# Patient Record
Sex: Male | Born: 1935 | Race: Black or African American | Hispanic: No | Marital: Married | Smoking: Never smoker
Health system: Southern US, Community
[De-identification: ages and names within clinical notes are randomized; demographics above are authoritative.]

## PROBLEM LIST (undated history)

## (undated) DIAGNOSIS — N289 Disorder of kidney and ureter, unspecified: Secondary | ICD-10-CM

## (undated) DIAGNOSIS — F0390 Unspecified dementia without behavioral disturbance: Secondary | ICD-10-CM

## (undated) DIAGNOSIS — I4901 Ventricular fibrillation: Secondary | ICD-10-CM

## (undated) DIAGNOSIS — K562 Volvulus: Secondary | ICD-10-CM

## (undated) DIAGNOSIS — M109 Gout, unspecified: Secondary | ICD-10-CM

## (undated) DIAGNOSIS — I214 Non-ST elevation (NSTEMI) myocardial infarction: Secondary | ICD-10-CM

## (undated) DIAGNOSIS — I469 Cardiac arrest, cause unspecified: Secondary | ICD-10-CM

## (undated) DIAGNOSIS — K859 Acute pancreatitis without necrosis or infection, unspecified: Secondary | ICD-10-CM

## (undated) DIAGNOSIS — I509 Heart failure, unspecified: Secondary | ICD-10-CM

## (undated) HISTORY — PX: COLECTOMY: SHX59

## (undated) HISTORY — PX: CT PERC CHOLECYSTOSTOMY: HXRAD817

---

## 2009-09-18 ENCOUNTER — Emergency Department (HOSPITAL_COMMUNITY): Admission: EM | Admit: 2009-09-18 | Discharge: 2009-09-18 | Payer: Self-pay | Admitting: Emergency Medicine

## 2009-09-20 ENCOUNTER — Emergency Department (HOSPITAL_COMMUNITY): Admission: EM | Admit: 2009-09-20 | Discharge: 2009-09-20 | Payer: Self-pay | Admitting: Emergency Medicine

## 2016-08-11 DIAGNOSIS — K562 Volvulus: Secondary | ICD-10-CM

## 2016-08-11 HISTORY — DX: Volvulus: K56.2

## 2016-09-30 ENCOUNTER — Other Ambulatory Visit (HOSPITAL_COMMUNITY): Payer: Medicare Other

## 2016-09-30 ENCOUNTER — Inpatient Hospital Stay
Admission: AD | Admit: 2016-09-30 | Discharge: 2016-12-22 | Disposition: A | Discharge status: Hospice | Payer: Medicare Other | Source: Other Acute Inpatient Hospital | Attending: Internal Medicine | Admitting: Internal Medicine

## 2016-09-30 DIAGNOSIS — R062 Wheezing: Secondary | ICD-10-CM

## 2016-09-30 DIAGNOSIS — Z4659 Encounter for fitting and adjustment of other gastrointestinal appliance and device: Secondary | ICD-10-CM

## 2016-09-30 DIAGNOSIS — Z9889 Other specified postprocedural states: Secondary | ICD-10-CM

## 2016-09-30 DIAGNOSIS — J189 Pneumonia, unspecified organism: Secondary | ICD-10-CM

## 2016-09-30 DIAGNOSIS — Z01818 Encounter for other preprocedural examination: Secondary | ICD-10-CM

## 2016-09-30 DIAGNOSIS — H04229 Epiphora due to insufficient drainage, unspecified lacrimal gland: Secondary | ICD-10-CM

## 2016-09-30 DIAGNOSIS — I429 Cardiomyopathy, unspecified: Secondary | ICD-10-CM

## 2016-09-30 DIAGNOSIS — Z431 Encounter for attention to gastrostomy: Secondary | ICD-10-CM

## 2016-09-30 DIAGNOSIS — IMO0002 Reserved for concepts with insufficient information to code with codable children: Secondary | ICD-10-CM

## 2016-09-30 DIAGNOSIS — Z95828 Presence of other vascular implants and grafts: Secondary | ICD-10-CM

## 2016-09-30 DIAGNOSIS — T82868A Thrombosis of vascular prosthetic devices, implants and grafts, initial encounter: Secondary | ICD-10-CM

## 2016-09-30 DIAGNOSIS — K769 Liver disease, unspecified: Secondary | ICD-10-CM

## 2016-09-30 DIAGNOSIS — T85698S Other mechanical complication of other specified internal prosthetic devices, implants and grafts, sequela: Secondary | ICD-10-CM

## 2016-09-30 DIAGNOSIS — N179 Acute kidney failure, unspecified: Secondary | ICD-10-CM | POA: Diagnosis present

## 2016-09-30 DIAGNOSIS — I4901 Ventricular fibrillation: Secondary | ICD-10-CM

## 2016-09-30 DIAGNOSIS — Z931 Gastrostomy status: Secondary | ICD-10-CM

## 2016-09-30 DIAGNOSIS — J9690 Respiratory failure, unspecified, unspecified whether with hypoxia or hypercapnia: Secondary | ICD-10-CM | POA: Diagnosis present

## 2016-09-30 DIAGNOSIS — I214 Non-ST elevation (NSTEMI) myocardial infarction: Secondary | ICD-10-CM | POA: Diagnosis present

## 2016-09-30 DIAGNOSIS — I469 Cardiac arrest, cause unspecified: Secondary | ICD-10-CM | POA: Diagnosis present

## 2016-09-30 DIAGNOSIS — Z992 Dependence on renal dialysis: Secondary | ICD-10-CM

## 2016-09-30 DIAGNOSIS — I82C19 Acute embolism and thrombosis of unspecified internal jugular vein: Secondary | ICD-10-CM

## 2016-09-30 HISTORY — DX: Acute pancreatitis without necrosis or infection, unspecified: K85.90

## 2016-09-30 HISTORY — DX: Heart failure, unspecified: I50.9

## 2016-09-30 HISTORY — DX: Cardiac arrest, cause unspecified: I46.9

## 2016-09-30 HISTORY — DX: Gout, unspecified: M10.9

## 2016-09-30 HISTORY — DX: Unspecified dementia, unspecified severity, without behavioral disturbance, psychotic disturbance, mood disturbance, and anxiety: F03.90

## 2016-09-30 HISTORY — DX: Disorder of kidney and ureter, unspecified: N28.9

## 2016-09-30 HISTORY — DX: Volvulus: K56.2

## 2016-09-30 HISTORY — DX: Non-ST elevation (NSTEMI) myocardial infarction: I21.4

## 2016-09-30 HISTORY — DX: Ventricular fibrillation: I49.01

## 2016-09-30 LAB — BLOOD GAS, ARTERIAL
Acid-base deficit: 9.2 mmol/L — ABNORMAL HIGH (ref 0.0–2.0)
Bicarbonate: 14.4 mmol/L — ABNORMAL LOW (ref 20.0–28.0)
Delivery systems: POSITIVE
EXPIRATORY PAP: 8
FIO2: 30
INSPIRATORY PAP: 16
O2 SAT: 97.1 %
PO2 ART: 92.7 mmHg (ref 83.0–108.0)
Patient temperature: 99.1
pCO2 arterial: 22.6 mmHg — ABNORMAL LOW (ref 32.0–48.0)
pH, Arterial: 7.422 (ref 7.350–7.450)

## 2016-10-01 LAB — COMPREHENSIVE METABOLIC PANEL
ALT: 35 U/L (ref 17–63)
ANION GAP: 10 (ref 5–15)
AST: 69 U/L — ABNORMAL HIGH (ref 15–41)
Albumin: 1.9 g/dL — ABNORMAL LOW (ref 3.5–5.0)
Alkaline Phosphatase: 65 U/L (ref 38–126)
BILIRUBIN TOTAL: 0.7 mg/dL (ref 0.3–1.2)
BUN: 112 mg/dL — ABNORMAL HIGH (ref 6–20)
CHLORIDE: 110 mmol/L (ref 101–111)
CO2: 16 mmol/L — ABNORMAL LOW (ref 22–32)
Calcium: 8.1 mg/dL — ABNORMAL LOW (ref 8.9–10.3)
Creatinine, Ser: 6.78 mg/dL — ABNORMAL HIGH (ref 0.61–1.24)
GFR calc Af Amer: 8 mL/min — ABNORMAL LOW (ref >60)
GFR, EST NON AFRICAN AMERICAN: 7 mL/min — AB (ref >60)
Glucose, Bld: 121 mg/dL — ABNORMAL HIGH (ref 65–99)
Potassium: 4.1 mmol/L (ref 3.5–5.1)
Sodium: 136 mmol/L (ref 135–145)
TOTAL PROTEIN: 7.3 g/dL (ref 6.5–8.1)

## 2016-10-01 LAB — CBC WITH DIFFERENTIAL/PLATELET
Basophils Absolute: 0 K/uL (ref 0.0–0.1)
Basophils Relative: 0 %
Eosinophils Absolute: 0.3 K/uL (ref 0.0–0.7)
Eosinophils Relative: 2 %
HCT: 26.5 % — ABNORMAL LOW (ref 39.0–52.0)
Hemoglobin: 8.5 g/dL — ABNORMAL LOW (ref 13.0–17.0)
Lymphocytes Relative: 13 %
Lymphs Abs: 2.1 K/uL (ref 0.7–4.0)
MCH: 28.2 pg (ref 26.0–34.0)
MCHC: 32.1 g/dL (ref 30.0–36.0)
MCV: 88 fL (ref 78.0–100.0)
Monocytes Absolute: 0.7 K/uL (ref 0.1–1.0)
Monocytes Relative: 4 %
Neutro Abs: 13.2 K/uL — ABNORMAL HIGH (ref 1.7–7.7)
Neutrophils Relative %: 81 %
Platelets: 146 K/uL — ABNORMAL LOW (ref 150–400)
RBC: 3.01 MIL/uL — ABNORMAL LOW (ref 4.22–5.81)
RDW: 16.5 % — ABNORMAL HIGH (ref 11.5–15.5)
WBC: 16.3 K/uL — ABNORMAL HIGH (ref 4.0–10.5)

## 2016-10-01 LAB — PROTIME-INR
INR: 1.91
Prothrombin Time: 22.1 seconds — ABNORMAL HIGH (ref 11.4–15.2)

## 2016-10-02 ENCOUNTER — Other Ambulatory Visit (HOSPITAL_COMMUNITY): Payer: Medicare Other

## 2016-10-02 ENCOUNTER — Encounter (HOSPITAL_COMMUNITY): Payer: Self-pay | Admitting: Interventional Radiology

## 2016-10-02 HISTORY — PX: IR GENERIC HISTORICAL: IMG1180011

## 2016-10-02 LAB — BASIC METABOLIC PANEL
Anion gap: 13 (ref 5–15)
BUN: 129 mg/dL — AB (ref 6–20)
CALCIUM: 8.3 mg/dL — AB (ref 8.9–10.3)
CHLORIDE: 110 mmol/L (ref 101–111)
CO2: 14 mmol/L — AB (ref 22–32)
CREATININE: 7.76 mg/dL — AB (ref 0.61–1.24)
GFR calc non Af Amer: 6 mL/min — ABNORMAL LOW (ref >60)
GFR, EST AFRICAN AMERICAN: 7 mL/min — AB (ref >60)
Glucose, Bld: 120 mg/dL — ABNORMAL HIGH (ref 65–99)
Potassium: 4.3 mmol/L (ref 3.5–5.1)
Sodium: 137 mmol/L (ref 135–145)

## 2016-10-02 LAB — CBC
HCT: 26.2 % — ABNORMAL LOW (ref 39.0–52.0)
HEMOGLOBIN: 8.6 g/dL — AB (ref 13.0–17.0)
MCH: 28.7 pg (ref 26.0–34.0)
MCHC: 32.8 g/dL (ref 30.0–36.0)
MCV: 87.3 fL (ref 78.0–100.0)
Platelets: 164 10*3/uL (ref 150–400)
RBC: 3 MIL/uL — ABNORMAL LOW (ref 4.22–5.81)
RDW: 16 % — AB (ref 11.5–15.5)
WBC: 15.5 10*3/uL — ABNORMAL HIGH (ref 4.0–10.5)

## 2016-10-02 MED ORDER — LIDOCAINE HCL (PF) 1 % IJ SOLN
INTRAMUSCULAR | Status: AC
  Filled 2016-10-02: qty 30

## 2016-10-02 MED ORDER — HEPARIN SODIUM (PORCINE) 1000 UNIT/ML IJ SOLN
INTRAMUSCULAR | Status: AC
  Filled 2016-10-02: qty 1

## 2016-10-02 NOTE — Consult Note (Signed)
[facility] CONSULT NOTE    Date: 10/02/2016                  Patient Name:  Jesus Vega  MRN: [id number]  DOB: 17-Jan-1932  Age / Sex: 83 y.o., male         PCP: No primary care provider on file.                 Service Requesting Consult: Hospitalist                 Reason for Consult: Acute renal failure, CKD stage IV             History of Present Illness: Patient is a 83 y.o. male with a PMHx of dementia, chronic kidney disease stage IV, congestive heart failure ejection fraction 25-30%, who was admitted to [facility] on 09/30/2016 for ongoing management of cholecystitis and volvulus status post exploratory laparotomy with colectomy. Patient was previously in [place]. The patient's cholecystitis was apparently treated with cholecystostomy tube. Patient also had a volvulus and had colectomy with subsequent colostomy formation.It was felt that he would most likely have a guarded prognosis however family desired aggressive care.  Patient's renal function has been quite poor while here. BUN continues to rise up to 129 with a creatinine of 7.7.   Medications: Current medications: Current Facility-Administered Medications  Medication Dose Route Frequency Provider Last Rate Last Dose  . heparin 1000 UNIT/ML injection           . lidocaine (PF) (XYLOCAINE) 1 % injection               Allergies: No known drug allergies   Past Medical History: Dementia Chronic kidney disease stage IV Congestive heart failure ejection fraction 25-30% Recent cholecystitis with volvulus Status post exploratory laparotomy with colectomy Respiratory failure   Past Surgical History: Past Surgical History:  Procedure Laterality Date  . IR GENERIC HISTORICAL  10/02/2016   IR FLUORO GUIDE CV LINE RIGHT 10/02/2016 Malachy Moan, MD [facility]  . IR GENERIC HISTORICAL  10/02/2016   IR US GUIDE VASC ACCESS RIGHT 10/02/2016 Malachy Moan, MD [facility]      Family History: Unable to obtain directly from the patient.  Social History: Unable to obtain from patient with altered mental status.   Review of Systems: Unable to obtain from patient and altered mental status.  Vital Signs: Pulse 87 respirations 31 blood pressure 110/67 pulse ox 92%  Physical Exam: General: Critically ill-appearing, currently on BiPAP.  Head: Normocephalic, atraumatic.  Eyes: Anicteric, EOMI  Nose: No epistaxis noted  Throat: Bipap facemask on  Neck: Supple, trachea midline.  Lungs:  Bilateral rhonchi, tachypneic  Heart: S1S2 no rubs  Abdomen:  Soft NT, bowel sound present, colostomy present  Extremities: No pretibial edema.  Neurologic: Awake, not following commands  Skin: No visible rashes, scars.    Lab results: Basic Metabolic Panel:  Recent Labs Lab 10/01/16 0600 10/02/16 1029  NA 136 137  K 4.1 4.3  CL 110 110  CO2 16* 14*  GLUCOSE 121* 120*  BUN 112* 129*  CREATININE 6.78* 7.76*  CALCIUM 8.1* 8.3*    Liver Function Tests:  Recent Labs Lab 10/01/16 0600  AST 69*  ALT 35  ALKPHOS 65  BILITOT 0.7  PROT 7.3  ALBUMIN 1.9*   No results for input(s): LIPASE, AMYLASE in the last 168 hours. No results for input(s): AMMONIA in the last 168 hours.  CBC:  Recent Labs Lab 10/01/16 0600 10/02/16 1029  WBC 16.3* 15.5*  NEUTROABS 13.2*  --   HGB 8.5* 8.6*  HCT 26.5* 26.2*  MCV 88.0 87.3  PLT 146* 164    Cardiac Enzymes: No results for input(s): CKTOTAL, CKMB, CKMBINDEX, TROPONINI in the last 168 hours.  BNP: Invalid input(s): POCBNP  CBG: No results for input(s): GLUCAP in the last 168 hours.  Microbiology: No results found for this or any previous visit.  Coagulation Studies:  Recent Labs  10/01/16 0600  LABPROT 22.1*  INR 1.91    Urinalysis: No results for input(s): COLORURINE, LABSPEC, PHURINE, GLUCOSEU, HGBUR, BILIRUBINUR, KETONESUR, PROTEINUR, UROBILINOGEN, NITRITE, LEUKOCYTESUR in the last 72  hours.  Invalid input(s): APPERANCEUR    Imaging: Ir Fluoro Guide Cv Line Right  Result Date: 10/02/2016 INDICATION: 83 year old male in need of hemodialysis. EXAM: IR RIGHT FLOURO GUIDE CV LINE; IR ULTRASOUND GUIDANCE VASC ACCESS RIGHT MEDICATIONS: None required ANESTHESIA/SEDATION: None required. FLUOROSCOPY TIME:  Fluoroscopy Time: 0 minutes 6 seconds (1 mGy). COMPLICATIONS: None immediate. PROCEDURE: Informed written consent was obtained from the patient after a thorough discussion of the procedural risks, benefits and alternatives. All questions were addressed. Maximal Sterile Barrier Technique was utilized including caps, mask, sterile gowns, sterile gloves, sterile drape, hand hygiene and skin antiseptic. A timeout was performed prior to the initiation of the procedure. The right internal jugular vein was interrogated with ultrasound and found to be widely patent. An image was obtained and stored for the medical record. Local anesthesia was attained by infiltration with 1% lidocaine. A small dermatotomy was made. Under real-time sonographic guidance, the vessel was punctured with an 18 gauge needle. An Amplatz wire was advanced into the right heart and into the inferior vena cava. The needle was removed over the wire and a 20 cm Mahurkar non tunneled hemodialysis catheter with an additional central venous access port was advanced over the wire and position with the tip in the upper right atrium. The wire was removed. The catheter was secured to the skin with 0 Prolene suture. The catheter was found to work well and was flushed with heparinized saline. Sterile bandages were applied. The patient tolerated the procedure well. IMPRESSION: Successful placement of a 20 cm Mahurkar non tunneled hemodialysis catheter with additional central venous access port via a right internal jugular vein approach. The catheter tips are in the upper right atrium and ready for immediate use. Electronically Signed   By:  Malachy Moan M.D.   On: 10/02/2016 15:29   Ir US Guide Vasc Access Right  Result Date: 10/02/2016 INDICATION: 83 year old male in need of hemodialysis. EXAM: IR RIGHT FLOURO GUIDE CV LINE; IR ULTRASOUND GUIDANCE VASC ACCESS RIGHT MEDICATIONS: None required ANESTHESIA/SEDATION: None required. FLUOROSCOPY TIME:  Fluoroscopy Time: 0 minutes 6 seconds (1 mGy). COMPLICATIONS: None immediate. PROCEDURE: Informed written consent was obtained from the patient after a thorough discussion of the procedural risks, benefits and alternatives. All questions were addressed. Maximal Sterile Barrier Technique was utilized including caps, mask, sterile gowns, sterile gloves, sterile drape, hand hygiene and skin antiseptic. A timeout was performed prior to the initiation of the procedure. The right internal jugular vein was interrogated with ultrasound and found to be widely patent. An image was obtained and stored for the medical record. Local anesthesia was attained by infiltration with 1% lidocaine. A small dermatotomy was made. Under real-time sonographic guidance, the vessel was punctured with an 18 gauge needle. An Amplatz wire was advanced into the right heart and into the  inferior vena cava. The needle was removed over the wire and a 20 cm Mahurkar non tunneled hemodialysis catheter with an additional central venous access port was advanced over the wire and position with the tip in the upper right atrium. The wire was removed. The catheter was secured to the skin with 0 Prolene suture. The catheter was found to work well and was flushed with heparinized saline. Sterile bandages were applied. The patient tolerated the procedure well. IMPRESSION: Successful placement of a 20 cm Mahurkar non tunneled hemodialysis catheter with additional central venous access port via a right internal jugular vein approach. The catheter tips are in the upper right atrium and ready for immediate use. Electronically Signed   By: Malachy Moan M.D.   On: 10/02/2016 15:29   Dg Chest Port 1 View  Result Date: 09/30/2016 CLINICAL DATA:  Acute respiratory failure. Nasogastric tube placement. EXAM: PORTABLE CHEST 1 VIEW COMPARISON:  None. FINDINGS: Nasogastric tube is seen with tip overlying the distal stomach. A left jugular central venous catheter seen with tip overlying the proximal SVC. Pigtail drainage catheter is seen in the right upper quadrant the abdomen, likely representing a cholecystostomy tube. No pneumothorax visualized. A moderate layering right pleural effusion is seen as well as a small left pleural effusion. Airspace opacity is seen bilaterally within the mid and lower lung zones. Heart size is within normal limits. Caps IMPRESSION: Bilateral layering pleural effusions, right side greater than left. Bilateral mid and lower lung airspace opacity. Nasogastric tube tip overlies the distal stomach. Electronically Signed   By: Myles Rosenthal M.D.   On: 09/30/2016 19:51   Dg Abd Portable 1v  Result Date: 10/02/2016 CLINICAL DATA:  NG tube placement EXAM: PORTABLE ABDOMEN - 1 VIEW COMPARISON:  09/30/2016 FINDINGS: Enteric tube terminates in the distal second portion of the duodenum. Mildly prominent loops of small bowel. Stool within the right colon. IMPRESSION: Enteric tube terminates in the distal second portion of the duodenum. Electronically Signed   By: Charline Bills M.D.   On: 10/02/2016 14:01   Dg Abd Portable 1v  Result Date: 09/30/2016 CLINICAL DATA:  NG tube placement EXAM: PORTABLE ABDOMEN - 1 VIEW COMPARISON:  None. FINDINGS: Mild gaseous distension of the stomach. Normal small bowel gas pattern. Degenerative changes lumbar spine. Mild levoscoliosis lumbar spine. There is NG tube in place with tip in mid stomach. IMPRESSION: NG tube in place with tip in mid stomach. Electronically Signed   By: Natasha Mead M.D.   On: 09/30/2016 19:52      Assessment & Plan: Pt is a 83 y.o. African American male with a PMHx of  dementia, chronic kidney disease stage IV, congestive heart failure ejection fraction 25-30%, who was admitted to [facility] on 09/30/2016 for ongoing management of cholecystitis and volvulus status post exploratory laparotomy with colectomy. Patient was previously in [place].   1.  Acute renal failure. 2.  CKD stage IV. 3.  Acute respiratory failure. 4.  Metabolic acidosis. 5.  Anemia of CKD.  Plan:  The patient's wife requests aggressive care in regards to his underlying acute renal failure. BUN currently up to 129 with a creatinine of 7.7.At this point and we will proceed with a trial of hemodialysis. We will plan for his first dialysis treatment tomorrow a.m. Temporalis his catheter was placed in the right internal jugular vein. Dialysis should help to improve his azotemia as well as his underlying metabolic acidosis.Patient remains on BiPAP at the moment. However his overall  prognosis remains quite guarded. We will continue to monitor his status closely with you.

## 2016-10-02 NOTE — Procedures (Signed)
Interventional Radiology Procedure Note  Procedure: Placement of a Right IJ 20 cm Mahurker non-tunneled HD catheter with additional CVC port. Tip location:  Upper RA and ready for use.   Complications: None  Estimated Blood Loss: 0  Recommendations:  - Routine line care  Signed,  Sterling BigHeath K. Vanecia Limpert, MD

## 2016-10-03 LAB — CBC
HEMATOCRIT: 22.1 % — AB (ref 39.0–52.0)
Hemoglobin: 7.3 g/dL — ABNORMAL LOW (ref 13.0–17.0)
MCH: 28.6 pg (ref 26.0–34.0)
MCHC: 33 g/dL (ref 30.0–36.0)
MCV: 86.7 fL (ref 78.0–100.0)
PLATELETS: 182 10*3/uL (ref 150–400)
RBC: 2.55 MIL/uL — ABNORMAL LOW (ref 4.22–5.81)
RDW: 15.9 % — AB (ref 11.5–15.5)
WBC: 13.2 10*3/uL — ABNORMAL HIGH (ref 4.0–10.5)

## 2016-10-03 LAB — RENAL FUNCTION PANEL
ALBUMIN: 1.7 g/dL — AB (ref 3.5–5.0)
Anion gap: 14 (ref 5–15)
BUN: 141 mg/dL — AB (ref 6–20)
CO2: 13 mmol/L — ABNORMAL LOW (ref 22–32)
CREATININE: 8.47 mg/dL — AB (ref 0.61–1.24)
Calcium: 8.4 mg/dL — ABNORMAL LOW (ref 8.9–10.3)
Chloride: 110 mmol/L (ref 101–111)
GFR calc Af Amer: 6 mL/min — ABNORMAL LOW (ref >60)
GFR calc non Af Amer: 5 mL/min — ABNORMAL LOW (ref >60)
GLUCOSE: 138 mg/dL — AB (ref 65–99)
Phosphorus: 5.2 mg/dL — ABNORMAL HIGH (ref 2.5–4.6)
Potassium: 4.3 mmol/L (ref 3.5–5.1)
SODIUM: 137 mmol/L (ref 135–145)

## 2016-10-04 ENCOUNTER — Other Ambulatory Visit (HOSPITAL_COMMUNITY): Payer: Medicare Other

## 2016-10-04 LAB — HEPATITIS B CORE ANTIBODY, IGM: Hep B C IgM: NEGATIVE

## 2016-10-04 LAB — HEPATITIS B SURFACE ANTIGEN: Hepatitis B Surface Ag: NEGATIVE

## 2016-10-04 LAB — HEPATITIS B SURFACE ANTIBODY,QUALITATIVE: HEP B S AB: NONREACTIVE

## 2016-10-04 NOTE — Progress Notes (Signed)
[facility]  ROUNDING NOTE   Subjective:  Patient continues to have worsening azotemia. Yesterday BUN was up to 141 with a creatinine of 8.47. However the patient did have first dialysis treatment yesterday. The plan for second dose of treatment tomorrow.   Objective:  Vital signs in last 24 hours:  Temperature 97.4 pulse 79 respirations 28 blood pressure 124/54  Physical Exam: General: Chronically ill-appearing  Head: BiPAP facemask on  Eyes: Anicteric  Neck: Supple, trachea midline  Lungs:  Coarse rhonchi bilateral, currently on BiPAP  Heart: S1S2 no rubs  Abdomen:  Soft, nontender, bowel sounds present  Extremities: trace peripheral edema.  Neurologic: Currently not following commands  Skin: No lesions  Access: R IJ temp HD catheter    Basic Metabolic Panel:  Recent Labs Lab 10/01/16 0600 10/02/16 1029 10/03/16 0500  NA 136 137 137  K 4.1 4.3 4.3  CL 110 110 110  CO2 16* 14* 13*  GLUCOSE 121* 120* 138*  BUN 112* 129* 141*  CREATININE 6.78* 7.76* 8.47*  CALCIUM 8.1* 8.3* 8.4*  PHOS  --   --  5.2*    Liver Function Tests:  Recent Labs Lab 10/01/16 0600 10/03/16 0500  AST 69*  --   ALT 35  --   ALKPHOS 65  --   BILITOT 0.7  --   PROT 7.3  --   ALBUMIN 1.9* 1.7*   No results for input(s): LIPASE, AMYLASE in the last 168 hours. No results for input(s): AMMONIA in the last 168 hours.  CBC:  Recent Labs Lab 10/01/16 0600 10/02/16 1029 10/03/16 0500  WBC 16.3* 15.5* 13.2*  NEUTROABS 13.2*  --   --   HGB 8.5* 8.6* 7.3*  HCT 26.5* 26.2* 22.1*  MCV 88.0 87.3 86.7  PLT 146* 164 182    Cardiac Enzymes: No results for input(s): CKTOTAL, CKMB, CKMBINDEX, TROPONINI in the last 168 hours.  BNP: Invalid input(s): POCBNP  CBG: No results for input(s): GLUCAP in the last 168 hours.  Microbiology: No results found for this or any previous visit.  Coagulation Studies: No results for input(s): LABPROT, INR in the last 72  hours.  Urinalysis: No results for input(s): COLORURINE, LABSPEC, PHURINE, GLUCOSEU, HGBUR, BILIRUBINUR, KETONESUR, PROTEINUR, UROBILINOGEN, NITRITE, LEUKOCYTESUR in the last 72 hours.  Invalid input(s): APPERANCEUR    Imaging: Dg Chest Port 1 View  Result Date: 10/04/2016 CLINICAL DATA:  Acute respiratory failure. EXAM: PORTABLE CHEST 1 VIEW COMPARISON:  09/30/2016 FINDINGS: Right IJ double lumen dialysis catheter tip is in the region of the cavoatrial junction. A left IJ catheter tip is near the brachiocephalic SVC junction. NG tube is coursing down the esophagus and into the stomach. Persistent right-sided pleural effusion and bilateral infiltrates. A small left effusion is also likely. IMPRESSION: Support apparatus in good position without complicating features. Persistent effusions and infiltrates. Electronically Signed   By: Rudie Meyer M.D.   On: 10/04/2016 08:26     Medications:       Assessment/ Plan:  83 y.o.  African American male with a PMHx of dementia, chronic kidney disease stage IV, congestive heart failure ejection fraction 25-30%, who was admitted to [facility] on 09/30/2016 for ongoing management of cholecystitis and volvulus status post exploratory laparotomy with colectomy. Patient was previously in [place].  1.  Acute renal failure. 2.  CKD stage IV. 3.  Acute respiratory failure. 4.  Metabolic acidosis. 5.  Anemia of CKD.  Plan:  Patient had first hemodialysis treatment yesterday. We'll  plan for a second dialysis treatment tomorrow and then switching to a Monday, Wednesday, Friday schedule. Continue to monitor serum electrolytes. Patient remains on BiPAP for his acute respiratory failure.  Hemoglobin currently down to 7.3. Consider blood transfusion for hemoglobin of 7 or less. We will continue to follow along with you.   LOS: 0 Jesus Vega 1/24/20184:13 PM

## 2016-10-05 LAB — CBC
HCT: 21.9 % — ABNORMAL LOW (ref 39.0–52.0)
Hemoglobin: 7.1 g/dL — ABNORMAL LOW (ref 13.0–17.0)
MCH: 28.3 pg (ref 26.0–34.0)
MCHC: 32.4 g/dL (ref 30.0–36.0)
MCV: 87.3 fL (ref 78.0–100.0)
PLATELETS: 190 10*3/uL (ref 150–400)
RBC: 2.51 MIL/uL — ABNORMAL LOW (ref 4.22–5.81)
RDW: 16.2 % — AB (ref 11.5–15.5)
WBC: 12.9 10*3/uL — AB (ref 4.0–10.5)

## 2016-10-05 LAB — RENAL FUNCTION PANEL
Albumin: 1.5 g/dL — ABNORMAL LOW (ref 3.5–5.0)
Anion gap: 15 (ref 5–15)
BUN: 140 mg/dL — AB (ref 6–20)
CALCIUM: 8.2 mg/dL — AB (ref 8.9–10.3)
CO2: 15 mmol/L — ABNORMAL LOW (ref 22–32)
Chloride: 108 mmol/L (ref 101–111)
Creatinine, Ser: 8.35 mg/dL — ABNORMAL HIGH (ref 0.61–1.24)
GFR calc Af Amer: 6 mL/min — ABNORMAL LOW (ref >60)
GFR, EST NON AFRICAN AMERICAN: 5 mL/min — AB (ref >60)
Glucose, Bld: 174 mg/dL — ABNORMAL HIGH (ref 65–99)
PHOSPHORUS: 4.9 mg/dL — AB (ref 2.5–4.6)
Potassium: 4.1 mmol/L (ref 3.5–5.1)
Sodium: 138 mmol/L (ref 135–145)

## 2016-10-05 MED ORDER — ALBUMIN HUMAN 25 % IV SOLN
INTRAVENOUS | Status: AC
  Filled 2016-10-05: qty 100

## 2016-10-06 ENCOUNTER — Other Ambulatory Visit (HOSPITAL_COMMUNITY): Payer: Medicare Other

## 2016-10-06 LAB — CBC
HCT: 22.8 % — ABNORMAL LOW (ref 39.0–52.0)
HEMOGLOBIN: 7.5 g/dL — AB (ref 13.0–17.0)
MCH: 28.6 pg (ref 26.0–34.0)
MCHC: 32.9 g/dL (ref 30.0–36.0)
MCV: 87 fL (ref 78.0–100.0)
PLATELETS: 169 10*3/uL (ref 150–400)
RBC: 2.62 MIL/uL — AB (ref 4.22–5.81)
RDW: 16 % — ABNORMAL HIGH (ref 11.5–15.5)
WBC: 17.3 10*3/uL — ABNORMAL HIGH (ref 4.0–10.5)

## 2016-10-06 LAB — RENAL FUNCTION PANEL
ANION GAP: 12 (ref 5–15)
Albumin: 1.9 g/dL — ABNORMAL LOW (ref 3.5–5.0)
BUN: 95 mg/dL — ABNORMAL HIGH (ref 6–20)
CHLORIDE: 108 mmol/L (ref 101–111)
CO2: 17 mmol/L — AB (ref 22–32)
Calcium: 7.8 mg/dL — ABNORMAL LOW (ref 8.9–10.3)
Creatinine, Ser: 6.22 mg/dL — ABNORMAL HIGH (ref 0.61–1.24)
GFR calc non Af Amer: 7 mL/min — ABNORMAL LOW (ref >60)
GFR, EST AFRICAN AMERICAN: 9 mL/min — AB (ref >60)
GLUCOSE: 125 mg/dL — AB (ref 65–99)
Phosphorus: 3.1 mg/dL (ref 2.5–4.6)
Potassium: 4.4 mmol/L (ref 3.5–5.1)
Sodium: 137 mmol/L (ref 135–145)

## 2016-10-06 NOTE — Progress Notes (Signed)
[facility]  ROUNDING NOTE   Subjective:  No new labs this a.m. Patient due for hemodialysis again today. Overall patient is critically ill.   Objective:  Vital signs in last 24 hours:  Pulse 77 respirations 25 blood pressure 105/75 pulse ox 92% on BiPAP  Physical Exam: General: Chronically ill-appearing  Head: BiPAP facemask on  Eyes: Anicteric  Neck: Supple, trachea midline  Lungs:  Coarse rhonchi bilateral, currently on BiPAP  Heart: S1S2 no rubs  Abdomen:  Soft, nontender, bowel sounds present  Extremities: trace peripheral edema.  Neurologic: Currently not following commands  Skin: No lesions  Access: R IJ temp HD catheter    Basic Metabolic Panel:  Recent Labs Lab 10/01/16 0600 10/02/16 1029 10/03/16 0500 10/05/16 0643  NA 136 137 137 138  K 4.1 4.3 4.3 4.1  CL 110 110 110 108  CO2 16* 14* 13* 15*  GLUCOSE 121* 120* 138* 174*  BUN 112* 129* 141* 140*  CREATININE 6.78* 7.76* 8.47* 8.35*  CALCIUM 8.1* 8.3* 8.4* 8.2*  PHOS  --   --  5.2* 4.9*    Liver Function Tests:  Recent Labs Lab 10/01/16 0600 10/03/16 0500 10/05/16 0643  AST 69*  --   --   ALT 35  --   --   ALKPHOS 65  --   --   BILITOT 0.7  --   --   PROT 7.3  --   --   ALBUMIN 1.9* 1.7* 1.5*   No results for input(s): LIPASE, AMYLASE in the last 168 hours. No results for input(s): AMMONIA in the last 168 hours.  CBC:  Recent Labs Lab 10/01/16 0600 10/02/16 1029 10/03/16 0500 10/05/16 0643 10/06/16 0806  WBC 16.3* 15.5* 13.2* 12.9* 17.3*  NEUTROABS 13.2*  --   --   --   --   HGB 8.5* 8.6* 7.3* 7.1* 7.5*  HCT 26.5* 26.2* 22.1* 21.9* 22.8*  MCV 88.0 87.3 86.7 87.3 87.0  PLT 146* 164 182 190 169    Cardiac Enzymes: No results for input(s): CKTOTAL, CKMB, CKMBINDEX, TROPONINI in the last 168 hours.  BNP: Invalid input(s): POCBNP  CBG: No results for input(s): GLUCAP in the last 168 hours.  Microbiology: No results found for this or any previous  visit.  Coagulation Studies: No results for input(s): LABPROT, INR in the last 72 hours.  Urinalysis: No results for input(s): COLORURINE, LABSPEC, PHURINE, GLUCOSEU, HGBUR, BILIRUBINUR, KETONESUR, PROTEINUR, UROBILINOGEN, NITRITE, LEUKOCYTESUR in the last 72 hours.  Invalid input(s): APPERANCEUR    Imaging: No results found.   Medications:       Assessment/ Plan:  83 y.o.  African American male with a PMHx of dementia, chronic kidney disease stage IV, congestive heart failure ejection fraction 25-30%, who was admitted to [facility] on 09/30/2016 for ongoing management of cholecystitis and volvulus status post exploratory laparotomy with colectomy. Patient was previously in [place].  1.  Acute renal failure. 2.  CKD stage IV. 3.  Acute respiratory failure. 4.  Metabolic acidosis. 5.  Anemia of CKD.  Plan:  Patient due for his third dialysis treatment today. He will be maintained on a Monday, Wednesday, Friday dialysis schedule. Orders for today have been prepared. Will also prepare dialysis orders for Monday. Continue to monitor hemoglobin and his hemoglobin was found to be 7.5 today. Consider blood transfusion for hemoglobin of 7 or less. Patient remains on BiPAP. Wean as tolerated. Overall prognosis appears quite guarded given underlying medical condition.   LOS: 0  Jesus Vega 1/26/20188:46 AM

## 2016-10-07 LAB — BLOOD GAS, ARTERIAL
Acid-Base Excess: 0.5 mmol/L (ref 0.0–2.0)
Bicarbonate: 23.6 mmol/L (ref 20.0–28.0)
DELIVERY SYSTEMS: POSITIVE
EXPIRATORY PAP: 8
FIO2: 30
INSPIRATORY PAP: 16
LHR: 25 {breaths}/min
O2 Saturation: 98.8 %
Patient temperature: 98.6
pCO2 arterial: 31.6 mmHg — ABNORMAL LOW (ref 32.0–48.0)
pH, Arterial: 7.486 — ABNORMAL HIGH (ref 7.350–7.450)
pO2, Arterial: 122 mmHg — ABNORMAL HIGH (ref 83.0–108.0)

## 2016-10-07 LAB — CBC
HCT: 21 % — ABNORMAL LOW (ref 39.0–52.0)
Hemoglobin: 6.7 g/dL — CL (ref 13.0–17.0)
MCH: 27.9 pg (ref 26.0–34.0)
MCHC: 31.9 g/dL (ref 30.0–36.0)
MCV: 87.5 fL (ref 78.0–100.0)
PLATELETS: 179 10*3/uL (ref 150–400)
RBC: 2.4 MIL/uL — AB (ref 4.22–5.81)
RDW: 16.1 % — ABNORMAL HIGH (ref 11.5–15.5)
WBC: 15.1 10*3/uL — AB (ref 4.0–10.5)

## 2016-10-07 LAB — PREPARE RBC (CROSSMATCH)

## 2016-10-07 LAB — ABO/RH: ABO/RH(D): A POS

## 2016-10-07 LAB — PROCALCITONIN: PROCALCITONIN: 3.03 ng/mL

## 2016-10-07 LAB — AMMONIA: Ammonia: 29 umol/L (ref 9–35)

## 2016-10-08 LAB — CBC
HEMATOCRIT: 24.9 % — AB (ref 39.0–52.0)
HEMOGLOBIN: 8.1 g/dL — AB (ref 13.0–17.0)
MCH: 28.5 pg (ref 26.0–34.0)
MCHC: 32.5 g/dL (ref 30.0–36.0)
MCV: 87.7 fL (ref 78.0–100.0)
Platelets: 158 10*3/uL (ref 150–400)
RBC: 2.84 MIL/uL — ABNORMAL LOW (ref 4.22–5.81)
RDW: 16.2 % — AB (ref 11.5–15.5)
WBC: 12.7 10*3/uL — AB (ref 4.0–10.5)

## 2016-10-08 LAB — TYPE AND SCREEN
ABO/RH(D): A POS
ANTIBODY SCREEN: NEGATIVE
Unit division: 0

## 2016-10-09 LAB — CBC
HEMATOCRIT: 25.3 % — AB (ref 39.0–52.0)
HEMOGLOBIN: 8.1 g/dL — AB (ref 13.0–17.0)
MCH: 28.1 pg (ref 26.0–34.0)
MCHC: 32 g/dL (ref 30.0–36.0)
MCV: 87.8 fL (ref 78.0–100.0)
Platelets: 157 10*3/uL (ref 150–400)
RBC: 2.88 MIL/uL — ABNORMAL LOW (ref 4.22–5.81)
RDW: 16.3 % — ABNORMAL HIGH (ref 11.5–15.5)
WBC: 12.8 10*3/uL — ABNORMAL HIGH (ref 4.0–10.5)

## 2016-10-09 LAB — RENAL FUNCTION PANEL
Albumin: 1.6 g/dL — ABNORMAL LOW (ref 3.5–5.0)
Anion gap: 9 (ref 5–15)
BUN: 89 mg/dL — ABNORMAL HIGH (ref 6–20)
CHLORIDE: 102 mmol/L (ref 101–111)
CO2: 24 mmol/L (ref 22–32)
Calcium: 7.9 mg/dL — ABNORMAL LOW (ref 8.9–10.3)
Creatinine, Ser: 5.6 mg/dL — ABNORMAL HIGH (ref 0.61–1.24)
GFR calc Af Amer: 10 mL/min — ABNORMAL LOW (ref >60)
GFR calc non Af Amer: 8 mL/min — ABNORMAL LOW (ref >60)
GLUCOSE: 151 mg/dL — AB (ref 65–99)
PHOSPHORUS: 2.3 mg/dL — AB (ref 2.5–4.6)
POTASSIUM: 3.9 mmol/L (ref 3.5–5.1)
Sodium: 135 mmol/L (ref 135–145)

## 2016-10-09 NOTE — Progress Notes (Signed)
[facility]  ROUNDING NOTE   Subjective:  Patient remains critically ill Seen during HD. BFR 350; DFR 800 BP is low Currently on BiPAP tachypneic   Objective:  Vital signs in last 24 hours:  Pulse 74 respirations 26 blood pressure 95/53 pulse ox 91-92% on BiPAP  Physical Exam: General: Chronically ill-appearing  Head: BiPAP facemask on  Eyes: Anicteric  Neck: Supple, trachea midline  Lungs:  Coarse rhonchi bilateral, currently on BiPAP, tachypneic  Heart: S1S2 no rubs  Abdomen:  Soft, nontender,   Extremities: ++ dependent edema  Over thighs  Neurologic: Currently not following commands  Skin: No lesions  Access: R IJ temp HD catheter    Basic Metabolic Panel:  Recent Labs Lab 10/03/16 0500 10/05/16 0643 10/06/16 0806 10/09/16 0609  NA 137 138 137 135  K 4.3 4.1 4.4 3.9  CL 110 108 108 102  CO2 13* 15* 17* 24  GLUCOSE 138* 174* 125* 151*  BUN 141* 140* 95* 89*  CREATININE 8.47* 8.35* 6.22* 5.60*  CALCIUM 8.4* 8.2* 7.8* 7.9*  PHOS 5.2* 4.9* 3.1 2.3*    Liver Function Tests:  Recent Labs Lab 10/03/16 0500 10/05/16 0643 10/06/16 0806 10/09/16 0609  ALBUMIN 1.7* 1.5* 1.9* 1.6*   No results for input(s): LIPASE, AMYLASE in the last 168 hours.  Recent Labs Lab 10/07/16 1903  AMMONIA 29    CBC:  Recent Labs Lab 10/05/16 0643 10/06/16 0806 10/07/16 0600 10/08/16 1643 10/09/16 0609  WBC 12.9* 17.3* 15.1* 12.7* 12.8*  HGB 7.1* 7.5* 6.7* 8.1* 8.1*  HCT 21.9* 22.8* 21.0* 24.9* 25.3*  MCV 87.3 87.0 87.5 87.7 87.8  PLT 190 169 179 158 157    Cardiac Enzymes: No results for input(s): CKTOTAL, CKMB, CKMBINDEX, TROPONINI in the last 168 hours.  BNP: Invalid input(s): POCBNP  CBG: No results for input(s): GLUCAP in the last 168 hours.  Microbiology: Results for orders placed or performed during the hospital encounter of 09/30/16  Culture, blood (routine x 2)     Status: None (Preliminary result)   Collection Time: 10/06/16   4:19 PM  Result Value Ref Range Status   Specimen Description BLOOD HEMODIALYSIS CATHETER  Final   Special Requests BOTTLES DRAWN AEROBIC ONLY 5CC  Final   Culture NO GROWTH 2 DAYS  Final   Report Status PENDING  Incomplete  Culture, blood (routine x 2)     Status: None (Preliminary result)   Collection Time: 10/06/16  4:20 PM  Result Value Ref Range Status   Specimen Description BLOOD HEMODIALYSIS CATHETER  Final   Special Requests BOTTLES DRAWN AEROBIC ONLY 10CC  Final   Culture NO GROWTH 2 DAYS  Final   Report Status PENDING  Incomplete    Coagulation Studies: No results for input(s): LABPROT, INR in the last 72 hours.  Urinalysis: No results for input(s): COLORURINE, LABSPEC, PHURINE, GLUCOSEU, HGBUR, BILIRUBINUR, KETONESUR, PROTEINUR, UROBILINOGEN, NITRITE, LEUKOCYTESUR in the last 72 hours.  Invalid input(s): APPERANCEUR    Imaging: No results found.   Medications:       Assessment/ Plan:  83 y.o.  African American male with a PMHx of dementia, chronic kidney disease stage IV, congestive heart failure ejection fraction 25-30%, who was admitted to [facility] on 09/30/2016 for ongoing management of cholecystitis and volvulus status post exploratory laparotomy with colectomy. Patient was previously in [place].  1.  Acute renal failure. 2.  CKD stage IV. 3.  Acute respiratory failure. 4.  Metabolic acidosis. 5.  Anemia of CKD.  Plan:  Patient  will be maintained on a Monday, Wednesday, Friday dialysis schedule.    Patient remains on BiPAP. Wean as tolerated. Tachypneic likely due to pleural effusions 3rd spacing of fluid from hypoalbuminemia Iv albumin during HD Overall prognosis appears quite guarded given underlying medical condition.   LOS: 0 Caris Cerveny 1/29/20182:31 PM

## 2016-10-10 ENCOUNTER — Other Ambulatory Visit (HOSPITAL_COMMUNITY): Payer: Medicare Other

## 2016-10-11 ENCOUNTER — Other Ambulatory Visit (HOSPITAL_COMMUNITY): Payer: Medicare Other

## 2016-10-11 ENCOUNTER — Institutional Professional Consult (permissible substitution) (HOSPITAL_COMMUNITY): Payer: Medicare Other

## 2016-10-11 LAB — CBC
HCT: 23.4 % — ABNORMAL LOW (ref 39.0–52.0)
Hemoglobin: 7.4 g/dL — ABNORMAL LOW (ref 13.0–17.0)
MCH: 28.4 pg (ref 26.0–34.0)
MCHC: 31.6 g/dL (ref 30.0–36.0)
MCV: 89.7 fL (ref 78.0–100.0)
PLATELETS: 149 10*3/uL — AB (ref 150–400)
RBC: 2.61 MIL/uL — ABNORMAL LOW (ref 4.22–5.81)
RDW: 17 % — ABNORMAL HIGH (ref 11.5–15.5)
WBC: 13.8 10*3/uL — ABNORMAL HIGH (ref 4.0–10.5)

## 2016-10-11 LAB — CULTURE, BLOOD (ROUTINE X 2)
CULTURE: NO GROWTH
Culture: NO GROWTH

## 2016-10-11 LAB — RENAL FUNCTION PANEL
ALBUMIN: 1.7 g/dL — AB (ref 3.5–5.0)
ANION GAP: 12 (ref 5–15)
BUN: 63 mg/dL — AB (ref 6–20)
CO2: 24 mmol/L (ref 22–32)
Calcium: 8 mg/dL — ABNORMAL LOW (ref 8.9–10.3)
Chloride: 97 mmol/L — ABNORMAL LOW (ref 101–111)
Creatinine, Ser: 4.53 mg/dL — ABNORMAL HIGH (ref 0.61–1.24)
GFR calc Af Amer: 13 mL/min — ABNORMAL LOW (ref >60)
GFR, EST NON AFRICAN AMERICAN: 11 mL/min — AB (ref >60)
Glucose, Bld: 171 mg/dL — ABNORMAL HIGH (ref 65–99)
PHOSPHORUS: 3 mg/dL (ref 2.5–4.6)
POTASSIUM: 4.3 mmol/L (ref 3.5–5.1)
SODIUM: 133 mmol/L — AB (ref 135–145)

## 2016-10-11 NOTE — Progress Notes (Signed)
[facility]  ROUNDING NOTE   Subjective:  Patient remains critically ill Seen during HD. BFR 350; DFR 800 BP is low Off of BiPAP D10 drip at 50 cc/hr   Objective:  Vital signs in last 24 hours:  Temp 97.6 Pulse 74 respirations 20 blood pressure 107/58   Physical Exam: General: Chronically ill-appearing  Head: [place] O2, NG tube in place  Eyes: Anicteric  Neck: Supple, trachea midline  Lungs:  Coarse rhonchi bilateral, [place] O2  Heart: S1S2 no rubs  Abdomen:  Soft, nontender,   Extremities: ++ dependent edema  Over thighs  Neurologic: Currently not following commands  Skin: No lesions  Access: R IJ temp HD catheter    Basic Metabolic Panel:  Recent Labs Lab 10/05/16 0643 10/06/16 0806 10/09/16 0609 10/11/16 0703  NA 138 137 135 133*  K 4.1 4.4 3.9 4.3  CL 108 108 102 97*  CO2 15* 17* 24 24  GLUCOSE 174* 125* 151* 171*  BUN 140* 95* 89* 63*  CREATININE 8.35* 6.22* 5.60* 4.53*  CALCIUM 8.2* 7.8* 7.9* 8.0*  PHOS 4.9* 3.1 2.3* 3.0    Liver Function Tests:  Recent Labs Lab 10/05/16 0643 10/06/16 0806 10/09/16 0609 10/11/16 0703  ALBUMIN 1.5* 1.9* 1.6* 1.7*   No results for input(s): LIPASE, AMYLASE in the last 168 hours.  Recent Labs Lab 10/07/16 1903  AMMONIA 29    CBC:  Recent Labs Lab 10/06/16 0806 10/07/16 0600 10/08/16 1643 10/09/16 0609 10/11/16 0703  WBC 17.3* 15.1* 12.7* 12.8* 13.8*  HGB 7.5* 6.7* 8.1* 8.1* 7.4*  HCT 22.8* 21.0* 24.9* 25.3* 23.4*  MCV 87.0 87.5 87.7 87.8 89.7  PLT 169 179 158 157 149*    Cardiac Enzymes: No results for input(s): CKTOTAL, CKMB, CKMBINDEX, TROPONINI in the last 168 hours.  BNP: Invalid input(s): POCBNP  CBG: No results for input(s): GLUCAP in the last 168 hours.  Microbiology: Results for orders placed or performed during the hospital encounter of 09/30/16  Culture, blood (routine x 2)     Status: None   Collection Time: 10/06/16  4:19 PM  Result Value Ref Range Status   Specimen  Description BLOOD HEMODIALYSIS CATHETER  Final   Special Requests BOTTLES DRAWN AEROBIC ONLY 5CC  Final   Culture NO GROWTH 5 DAYS  Final   Report Status 10/11/2016 FINAL  Final  Culture, blood (routine x 2)     Status: None   Collection Time: 10/06/16  4:20 PM  Result Value Ref Range Status   Specimen Description BLOOD HEMODIALYSIS CATHETER  Final   Special Requests BOTTLES DRAWN AEROBIC ONLY 10CC  Final   Culture NO GROWTH 5 DAYS  Final   Report Status 10/11/2016 FINAL  Final    Coagulation Studies: No results for input(s): LABPROT, INR in the last 72 hours.  Urinalysis: No results for input(s): COLORURINE, LABSPEC, PHURINE, GLUCOSEU, HGBUR, BILIRUBINUR, KETONESUR, PROTEINUR, UROBILINOGEN, NITRITE, LEUKOCYTESUR in the last 72 hours.  Invalid input(s): APPERANCEUR    Imaging: Dg Abd Portable 1v  Result Date: 10/11/2016 CLINICAL DATA:  NG tube placement. EXAM: PORTABLE ABDOMEN - 1 VIEW COMPARISON:  10 minutes prior. FINDINGS: The tip of the enteric tube is below the diaphragm in the stomach, side-port in the region of the gastroesophageal junction. Advancement of at least 3 cm would place the side-port below the diaphragm. Unchanged bowel gas pattern from prior. Right upper quadrant drain in place. IMPRESSION: Tip of the enteric tube below the diaphragm in the stomach. Side-port in the region  of the gastroesophageal junction, advancement of 3 cm would place the side-port in the stomach. Electronically Signed   By: Rubye OaksMelanie  Ehinger M.D.   On: 10/11/2016 02:56   Dg Abd Portable 1v  Result Date: 10/11/2016 CLINICAL DATA:  Nasogastric tube placement EXAM: PORTABLE ABDOMEN - 1 VIEW COMPARISON:  10/10/2016 FINDINGS: The nasogastric tube continues to be coiled, not extending below the diaphragm. Tip of the tube extends back more proximally and is above the upper edge of this image. IMPRESSION: Nasogastric tube does not extend below the diaphragm. It is turned on itself with the tip directed  proximally. Electronically Signed   By: Ellery Plunkaniel R Mitchell M.D.   On: 10/11/2016 02:51   Dg Abd Portable 1v  Result Date: 10/10/2016 CLINICAL DATA:  Nasogastric tube placement.  Initial encounter. EXAM: PORTABLE ABDOMEN - 1 VIEW COMPARISON:  Abdominal radiograph performed 10/06/2016 FINDINGS: The patient's enteric tube is seen coiled at the level of the distal esophagus and extending back to the level of the upper esophagus. This should be retracted at least 25 cm and replaced. The visualized bowel gas pattern is grossly unremarkable. A drainage catheter is noted at the right upper quadrant. Small bilateral pleural effusions are seen. The cardiomediastinal silhouette remains enlarged. Mild degenerative change is noted at the lower lumbar spine. The patient's right IJ line is noted ending about the distal SVC. IMPRESSION: 1. Enteric tube noted coiled at the level of the distal esophagus and extending back to the level of the upper esophagus. This should be retracted at least 25 cm and replaced. 2. Cardiomegaly.  Small bilateral pleural effusions seen. These results were called by telephone at the time of interpretation on 10/10/2016 at 5:07 pm to Joy RN at [facility], who verbally acknowledged these results. Electronically Signed   By: Roanna RaiderJeffery  Chang M.D.   On: 10/10/2016 17:08     Medications:       Assessment/ Plan:  83 y.o.  African American male with a PMHx of dementia, chronic kidney disease stage IV, congestive heart failure ejection fraction 25-30%, who was admitted to [facility] on 09/30/2016 for ongoing management of cholecystitis and volvulus status post exploratory laparotomy with colectomy. Patient was previously in [place].  1.  Acute renal failure. 2.  CKD stage IV. 3.  Acute respiratory failure. 4.  Metabolic acidosis.    Plan:  Patient  will be maintained on a Monday, Wednesday, Friday dialysis schedule.    3rd spacing of fluid from  hypoalbuminemia Iv albumin during HD- 1200 cc removed so far in to treatment Overall prognosis appears quite guarded given underlying medical conditions.   LOS: 0 Akilah Cureton 1/31/20184:11 PM

## 2016-10-12 ENCOUNTER — Other Ambulatory Visit (HOSPITAL_COMMUNITY): Payer: Medicare Other

## 2016-10-13 LAB — BLOOD GAS, ARTERIAL
ACID-BASE EXCESS: 2.7 mmol/L — AB (ref 0.0–2.0)
BICARBONATE: 26.8 mmol/L (ref 20.0–28.0)
Delivery systems: POSITIVE
EXPIRATORY PAP: 8
FIO2: 60
Inspiratory PAP: 16
O2 SAT: 99.2 %
PCO2 ART: 42 mmHg (ref 32.0–48.0)
PH ART: 7.421 (ref 7.350–7.450)
PO2 ART: 159 mmHg — AB (ref 83.0–108.0)
Patient temperature: 98.6

## 2016-10-13 LAB — CBC
HCT: 24.3 % — ABNORMAL LOW (ref 39.0–52.0)
Hemoglobin: 7.7 g/dL — ABNORMAL LOW (ref 13.0–17.0)
MCH: 28.5 pg (ref 26.0–34.0)
MCHC: 31.7 g/dL (ref 30.0–36.0)
MCV: 90 fL (ref 78.0–100.0)
PLATELETS: 192 10*3/uL (ref 150–400)
RBC: 2.7 MIL/uL — AB (ref 4.22–5.81)
RDW: 16.3 % — ABNORMAL HIGH (ref 11.5–15.5)
WBC: 14.1 10*3/uL — AB (ref 4.0–10.5)

## 2016-10-13 LAB — RENAL FUNCTION PANEL
ALBUMIN: 1.8 g/dL — AB (ref 3.5–5.0)
Anion gap: 10 (ref 5–15)
BUN: 51 mg/dL — AB (ref 6–20)
CALCIUM: 7.9 mg/dL — AB (ref 8.9–10.3)
CHLORIDE: 97 mmol/L — AB (ref 101–111)
CO2: 23 mmol/L (ref 22–32)
CREATININE: 4.07 mg/dL — AB (ref 0.61–1.24)
GFR, EST AFRICAN AMERICAN: 14 mL/min — AB (ref >60)
GFR, EST NON AFRICAN AMERICAN: 12 mL/min — AB (ref >60)
Glucose, Bld: 148 mg/dL — ABNORMAL HIGH (ref 65–99)
PHOSPHORUS: 3.2 mg/dL (ref 2.5–4.6)
Potassium: 4.3 mmol/L (ref 3.5–5.1)
SODIUM: 130 mmol/L — AB (ref 135–145)

## 2016-10-13 NOTE — Progress Notes (Signed)
[facility]  ROUNDING NOTE   Subjective:   Seen and examined on hemodialysis. Hypotensive during treatment. Unable to pull ultrafiltration. Will end up positive after treatment.    Objective:  Vital signs in last 24 hours:  Temp 97.7 Pulse 82 respirations 35 blood pressure 90/37 SpO2 91%  Physical Exam: General: Chronically ill-appearing  Head: BiPap, NG tube in place  Eyes: Anicteric  Neck: Supple, trachea midline  Lungs:  Bilateral crackles  Heart: S1S2 no rubs  Abdomen:  Soft, nontender,   Extremities: ++ dependent edema  Neurologic: not following commands  Skin: No lesions  Access: R IJ temp HD catheter    Basic Metabolic Panel:  Recent Labs Lab 10/09/16 0609 10/11/16 0703 10/13/16 0845  NA 135 133* 130*  K 3.9 4.3 4.3  CL 102 97* 97*  CO2 24 24 23   GLUCOSE 151* 171* 148*  BUN 89* 63* 51*  CREATININE 5.60* 4.53* 4.07*  CALCIUM 7.9* 8.0* 7.9*  PHOS 2.3* 3.0 3.2    Liver Function Tests:  Recent Labs Lab 10/09/16 0609 10/11/16 0703 10/13/16 0845  ALBUMIN 1.6* 1.7* 1.8*   No results for input(s): LIPASE, AMYLASE in the last 168 hours.  Recent Labs Lab 10/07/16 1903  AMMONIA 29    CBC:  Recent Labs Lab 10/07/16 0600 10/08/16 1643 10/09/16 0609 10/11/16 0703 10/13/16 0845  WBC 15.1* 12.7* 12.8* 13.8* 14.1*  HGB 6.7* 8.1* 8.1* 7.4* 7.7*  HCT 21.0* 24.9* 25.3* 23.4* 24.3*  MCV 87.5 87.7 87.8 89.7 90.0  PLT 179 158 157 149* 192    Cardiac Enzymes: No results for input(s): CKTOTAL, CKMB, CKMBINDEX, TROPONINI in the last 168 hours.  BNP: Invalid input(s): POCBNP  CBG: No results for input(s): GLUCAP in the last 168 hours.  Microbiology: Results for orders placed or performed during the hospital encounter of 09/30/16  Culture, blood (routine x 2)     Status: None   Collection Time: 10/06/16  4:19 PM  Result Value Ref Range Status   Specimen Description BLOOD HEMODIALYSIS CATHETER  Final   Special Requests BOTTLES DRAWN  AEROBIC ONLY 5CC  Final   Culture NO GROWTH 5 DAYS  Final   Report Status 10/11/2016 FINAL  Final  Culture, blood (routine x 2)     Status: None   Collection Time: 10/06/16  4:20 PM  Result Value Ref Range Status   Specimen Description BLOOD HEMODIALYSIS CATHETER  Final   Special Requests BOTTLES DRAWN AEROBIC ONLY 10CC  Final   Culture NO GROWTH 5 DAYS  Final   Report Status 10/11/2016 FINAL  Final    Coagulation Studies: No results for input(s): LABPROT, INR in the last 72 hours.  Urinalysis: No results for input(s): COLORURINE, LABSPEC, PHURINE, GLUCOSEU, HGBUR, BILIRUBINUR, KETONESUR, PROTEINUR, UROBILINOGEN, NITRITE, LEUKOCYTESUR in the last 72 hours.  Invalid input(s): APPERANCEUR    Imaging: Dg Chest Port 1 View  Result Date: 10/12/2016 CLINICAL DATA:  Respiratory failure EXAM: PORTABLE CHEST 1 VIEW COMPARISON:  10/04/2016 FINDINGS: Cardiac shadow is stable. Right jugular central line and nasogastric catheter are stable. Left jugular central line is been removed in the interval. Right-sided pleural effusion is again identified and stable. Increasing left-sided effusion is noted. Central vascular congestion is noted. IMPRESSION: Increasing left-sided pleural effusion. Stable right pleural effusion. Increased vascular congestion. Electronically Signed   By: Alcide CleverMark  Lukens M.D.   On: 10/12/2016 08:08   Dg Abd Portable 1v  Result Date: 10/11/2016 CLINICAL DATA:  Nasogastric tube placement EXAM: PORTABLE ABDOMEN - 1  VIEW COMPARISON:  Portable exam 1734 hours compared to 0245 hours FINDINGS: Tip of nasogastric tube projects over stomach though uncertain position of the proximal side-port with respect to the GE junction, suspect above ; recommend advancing tube 5 cm. Nonobstructive bowel gas pattern. Bibasilar pleural effusions and atelectasis noted. Numerous leads project over abdomen. IMPRESSION: Tip of nasogastric tube projects over stomach though the proximal side-port is likely above  the gastroesophageal junction, recommend advancing tube 5 cm. Electronically Signed   By: Ulyses Southward M.D.   On: 10/11/2016 17:56     Medications:       Assessment/ Plan:  83 y.o.  African American male with a PMHx of dementia, chronic kidney disease stage IV, congestive heart failure ejection fraction 25-30%, who was admitted to [facility] on 09/30/2016 for ongoing management of cholecystitis and volvulus status post exploratory laparotomy with colectomy. Patient was previously in [place].  1.  Acute renal failure. 2.  CKD stage IV. 3.  Acute respiratory failure. 4.  Metabolic acidosis.    Plan:  Patient  will be maintained on a Monday, Wednesday, Friday dialysis schedule.    3rd spacing of fluid secondary to hypoalbuminemia Iv albumin during hemodialysis treatment.  Overall prognosis appears quite guarded given underlying medical conditions.   LOS: 0 Trayonna Bachmeier 2/2/20184:40 PM

## 2016-10-15 ENCOUNTER — Institutional Professional Consult (permissible substitution) (HOSPITAL_COMMUNITY): Payer: Medicare Other

## 2016-10-15 ENCOUNTER — Other Ambulatory Visit (HOSPITAL_COMMUNITY): Payer: Medicare Other

## 2016-10-15 LAB — BLOOD GAS, ARTERIAL
ACID-BASE EXCESS: 1.2 mmol/L (ref 0.0–2.0)
BICARBONATE: 25.1 mmol/L (ref 20.0–28.0)
FIO2: 40
LHR: 16 {breaths}/min
MECHVT: 500 mL
O2 Saturation: 97.6 %
PEEP: 5 cmH2O
PO2 ART: 92.4 mmHg (ref 83.0–108.0)
Patient temperature: 98
pCO2 arterial: 37.9 mmHg (ref 32.0–48.0)
pH, Arterial: 7.435 (ref 7.350–7.450)

## 2016-10-16 ENCOUNTER — Other Ambulatory Visit (HOSPITAL_COMMUNITY): Payer: Medicare Other

## 2016-10-16 LAB — CBC
HCT: 22.6 % — ABNORMAL LOW (ref 39.0–52.0)
HCT: 19 % — ABNORMAL LOW (ref 39.0–52.0)
Hemoglobin: 7.1 g/dL — ABNORMAL LOW (ref 13.0–17.0)
Hemoglobin: 6.1 g/dL — CL (ref 13.0–17.0)
MCH: 28.1 pg (ref 26.0–34.0)
MCH: 28.4 pg (ref 26.0–34.0)
MCHC: 31.4 g/dL (ref 30.0–36.0)
MCHC: 32.1 g/dL (ref 30.0–36.0)
MCV: 89.3 fL (ref 78.0–100.0)
MCV: 88.4 fL (ref 78.0–100.0)
PLATELETS: 177 10*3/uL (ref 150–400)
PLATELETS: 167 10*3/uL (ref 150–400)
RBC: 2.53 MIL/uL — AB (ref 4.22–5.81)
RBC: 2.15 MIL/uL — ABNORMAL LOW (ref 4.22–5.81)
RDW: 16.1 % — AB (ref 11.5–15.5)
RDW: 16 % — ABNORMAL HIGH (ref 11.5–15.5)
WBC: 13.1 10*3/uL — ABNORMAL HIGH (ref 4.0–10.5)
WBC: 12.7 10*3/uL — ABNORMAL HIGH (ref 4.0–10.5)

## 2016-10-16 LAB — BLOOD GAS, ARTERIAL
ACID-BASE DEFICIT: 0.3 mmol/L (ref 0.0–2.0)
BICARBONATE: 22.7 mmol/L (ref 20.0–28.0)
FIO2: 30
MECHVT: 500 mL
O2 SAT: 98.7 %
PATIENT TEMPERATURE: 98.6
PCO2 ART: 29.6 mmHg — AB (ref 32.0–48.0)
PEEP/CPAP: 5 cmH2O
PH ART: 7.496 — AB (ref 7.350–7.450)
PO2 ART: 112 mmHg — AB (ref 83.0–108.0)
RATE: 16 resp/min

## 2016-10-16 LAB — RENAL FUNCTION PANEL
ANION GAP: 10 (ref 5–15)
Albumin: 1.7 g/dL — ABNORMAL LOW (ref 3.5–5.0)
BUN: 64 mg/dL — ABNORMAL HIGH (ref 6–20)
CALCIUM: 7.5 mg/dL — AB (ref 8.9–10.3)
CHLORIDE: 91 mmol/L — AB (ref 101–111)
CO2: 24 mmol/L (ref 22–32)
Creatinine, Ser: 4.46 mg/dL — ABNORMAL HIGH (ref 0.61–1.24)
GFR calc non Af Amer: 11 mL/min — ABNORMAL LOW (ref >60)
GFR, EST AFRICAN AMERICAN: 13 mL/min — AB (ref >60)
GLUCOSE: 165 mg/dL — AB (ref 65–99)
Phosphorus: 1.5 mg/dL — ABNORMAL LOW (ref 2.5–4.6)
Potassium: 4.5 mmol/L (ref 3.5–5.1)
SODIUM: 125 mmol/L — AB (ref 135–145)

## 2016-10-16 LAB — PREPARE RBC (CROSSMATCH)

## 2016-10-16 NOTE — Progress Notes (Signed)
[facility]  ROUNDING NOTE   Subjective:  Patient seen and evaluated during hemodialysis. Currently on the ventilator. CODE STATUS reversed a full code.  blood pressure noted to be low during dialysis   Objective:  Vital signs in last 24 hours:  Temperature 98.2 pulse 69 respirations 21 blood pressure 78/46  Physical Exam: General: Chronically ill-appearing  Head: ETT in place  Eyes: Anicteric  Neck: Supple, trachea midline  Lungs:  Scattered rhonchi, on vent  Heart: S1S2 no rubs  Abdomen:  Soft, nontender, bowel sounds present, colostomy in pace  Extremities: ++ dependent edema  Neurologic: not following commands  Skin: No lesions  Access: R IJ temp HD catheter    Basic Metabolic Panel:  Recent Labs Lab 10/11/16 0703 10/13/16 0845 10/16/16 0716  NA 133* 130* 125*  K 4.3 4.3 4.5  CL 97* 97* 91*  CO2 24 23 24   GLUCOSE 171* 148* 165*  BUN 63* 51* 64*  CREATININE 4.53* 4.07* 4.46*  CALCIUM 8.0* 7.9* 7.5*  PHOS 3.0 3.2 1.5*    Liver Function Tests:  Recent Labs Lab 10/11/16 0703 10/13/16 0845 10/16/16 0716  ALBUMIN 1.7* 1.8* 1.7*   No results for input(s): LIPASE, AMYLASE in the last 168 hours. No results for input(s): AMMONIA in the last 168 hours.  CBC:  Recent Labs Lab 10/11/16 0703 10/13/16 0845 10/16/16 0716  WBC 13.8* 14.1* 13.1*  HGB 7.4* 7.7* 7.1*  HCT 23.4* 24.3* 22.6*  MCV 89.7 90.0 89.3  PLT 149* 192 177    Cardiac Enzymes: No results for input(s): CKTOTAL, CKMB, CKMBINDEX, TROPONINI in the last 168 hours.  BNP: Invalid input(s): POCBNP  CBG: No results for input(s): GLUCAP in the last 168 hours.  Microbiology: Results for orders placed or performed during the hospital encounter of 09/30/16  Culture, blood (routine x 2)     Status: None   Collection Time: 10/06/16  4:19 PM  Result Value Ref Range Status   Specimen Description BLOOD HEMODIALYSIS CATHETER  Final   Special Requests BOTTLES DRAWN AEROBIC ONLY 5CC   Final   Culture NO GROWTH 5 DAYS  Final   Report Status 10/11/2016 FINAL  Final  Culture, blood (routine x 2)     Status: None   Collection Time: 10/06/16  4:20 PM  Result Value Ref Range Status   Specimen Description BLOOD HEMODIALYSIS CATHETER  Final   Special Requests BOTTLES DRAWN AEROBIC ONLY 10CC  Final   Culture NO GROWTH 5 DAYS  Final   Report Status 10/11/2016 FINAL  Final    Coagulation Studies: No results for input(s): LABPROT, INR in the last 72 hours.  Urinalysis: No results for input(s): COLORURINE, LABSPEC, PHURINE, GLUCOSEU, HGBUR, BILIRUBINUR, KETONESUR, PROTEINUR, UROBILINOGEN, NITRITE, LEUKOCYTESUR in the last 72 hours.  Invalid input(s): APPERANCEUR    Imaging: Dg Chest Port 1 View  Result Date: 10/16/2016 CLINICAL DATA:  Respiratory failure, intubated patient, bilateral pleural effusions. EXAM: PORTABLE CHEST 1 VIEW COMPARISON:  Portable chest x-ray of October 15, 2016. FINDINGS: The lungs are reasonably well inflated. Bibasilar and mid lung densities persist. There are bilateral pleural effusions layering posteriorly obscuring the costophrenic angles. There is no pleural effusion. The cardiac silhouette is largely obscured. The pulmonary vascularity is not clearly engorged. The endotracheal tube tip lies 3.7 cm above the carina. The dual-lumen right internal jugular venous catheter tip projects over the distal third of the SVC. The esophagogastric tube tip projects below the inferior margin of the image. IMPRESSION: Persistent moderate to large  bilateral pleural effusions with bibasilar atelectasis or pneumonia. No definite pulmonary edema. The support tubes are in reasonable position. Electronically Signed   By: David  SwazilandJordan M.D.   On: 10/16/2016 07:47   Dg Chest Port 1 View  Result Date: 10/15/2016 CLINICAL DATA:  Encounter for ET tube placement/repositioning. EXAM: PORTABLE CHEST 1 VIEW COMPARISON:  10/15/2016 FINDINGS: The endotracheal tube has been repositioned,  tip estimated to be 3.6 cm above the carina. Nasogastric tube is in place, tip beyond the film, below the lower esophagus. Right subclavian line tip overlies the level of the lower superior vena cava. Diffuse airspace filling opacities are noted bilaterally. Bilateral pleural effusions are present. IMPRESSION: Repositioned endotracheal tube. Bilateral lung opacities and pleural effusions. Electronically Signed   By: Norva PavlovElizabeth  Brown M.D.   On: 10/15/2016 17:31   Dg Chest Port 1 View  Result Date: 10/15/2016 CLINICAL DATA:  Respiratory failure.  ETT tube placement. EXAM: PORTABLE CHEST 1 VIEW COMPARISON:  October 12, 2016 FINDINGS: The distal tip of the ET tube is 7 mm above the carina but does not extend down either mainstem bronchus. Recommend withdrawing 2 cm. A right central line is stable. No pneumothorax. Bilateral pleural effusions with underlying opacities, right greater than left. No change in the cardiomediastinal silhouette. IMPRESSION: 1. Low-lying ETT.  Recommend withdrawing 2 cm. 2. Bilateral pleural effusions, right greater than left, with underlying pulmonary opacities. These results will be called to the ordering clinician or representative by the Radiologist Assistant, and communication documented in the PACS or zVision Dashboard. Electronically Signed   By: Gerome Samavid  Williams III M.D   On: 10/15/2016 15:46     Medications:       Assessment/ Plan:  83 y.o.  African American male with a PMHx of dementia, chronic kidney disease stage IV, congestive heart failure ejection fraction 25-30%, who was admitted to [facility] on 09/30/2016 for ongoing management of cholecystitis and volvulus status post exploratory laparotomy with colectomy. Patient was previously in [place].  1.  Acute renal failure. 2.  CKD stage IV. 3.  Acute respiratory failure. 4.  Metabolic acidosis.    Plan:  Patient seen and evaluated during hemodialysis. Noted to be hypotensive at the moment.  Therefore we will add norepinephrineto be able to safely dialyze the patient. Otherwise she will continue supportive care with ventilatory support. Continue to use intravenous albumin to help support blood pressure during dialysis as well. Next scheduled dialysis on Wednesday.   LOS: 0 Jesus Vega 2/5/20182:38 PM

## 2016-10-17 ENCOUNTER — Other Ambulatory Visit (HOSPITAL_COMMUNITY): Payer: Medicare Other

## 2016-10-17 ENCOUNTER — Encounter: Payer: Self-pay | Admitting: Cardiology

## 2016-10-17 ENCOUNTER — Other Ambulatory Visit (HOSPITAL_BASED_OUTPATIENT_CLINIC_OR_DEPARTMENT_OTHER): Payer: Medicare Other

## 2016-10-17 DIAGNOSIS — D631 Anemia in chronic kidney disease: Secondary | ICD-10-CM

## 2016-10-17 DIAGNOSIS — I469 Cardiac arrest, cause unspecified: Secondary | ICD-10-CM | POA: Diagnosis not present

## 2016-10-17 DIAGNOSIS — I255 Ischemic cardiomyopathy: Secondary | ICD-10-CM | POA: Diagnosis not present

## 2016-10-17 DIAGNOSIS — Z01818 Encounter for other preprocedural examination: Secondary | ICD-10-CM

## 2016-10-17 DIAGNOSIS — I214 Non-ST elevation (NSTEMI) myocardial infarction: Secondary | ICD-10-CM | POA: Diagnosis present

## 2016-10-17 DIAGNOSIS — I429 Cardiomyopathy, unspecified: Secondary | ICD-10-CM

## 2016-10-17 DIAGNOSIS — N186 End stage renal disease: Secondary | ICD-10-CM

## 2016-10-17 DIAGNOSIS — J9690 Respiratory failure, unspecified, unspecified whether with hypoxia or hypercapnia: Secondary | ICD-10-CM | POA: Diagnosis present

## 2016-10-17 DIAGNOSIS — I4901 Ventricular fibrillation: Secondary | ICD-10-CM

## 2016-10-17 DIAGNOSIS — Z992 Dependence on renal dialysis: Secondary | ICD-10-CM

## 2016-10-17 DIAGNOSIS — J9610 Chronic respiratory failure, unspecified whether with hypoxia or hypercapnia: Secondary | ICD-10-CM | POA: Diagnosis not present

## 2016-10-17 DIAGNOSIS — N179 Acute kidney failure, unspecified: Secondary | ICD-10-CM | POA: Diagnosis present

## 2016-10-17 LAB — CBC
HEMATOCRIT: 25 % — AB (ref 39.0–52.0)
HEMOGLOBIN: 8 g/dL — AB (ref 13.0–17.0)
MCH: 28.2 pg (ref 26.0–34.0)
MCHC: 32 g/dL (ref 30.0–36.0)
MCV: 88 fL (ref 78.0–100.0)
Platelets: 197 10*3/uL (ref 150–400)
RBC: 2.84 MIL/uL — AB (ref 4.22–5.81)
RDW: 15.8 % — ABNORMAL HIGH (ref 11.5–15.5)
WBC: 17.9 10*3/uL — ABNORMAL HIGH (ref 4.0–10.5)

## 2016-10-17 LAB — BASIC METABOLIC PANEL WITH GFR
Anion gap: 10 (ref 5–15)
BUN: 44 mg/dL — ABNORMAL HIGH (ref 6–20)
CO2: 25 mmol/L (ref 22–32)
Calcium: 7.4 mg/dL — ABNORMAL LOW (ref 8.9–10.3)
Chloride: 92 mmol/L — ABNORMAL LOW (ref 101–111)
Creatinine, Ser: 3.33 mg/dL — ABNORMAL HIGH (ref 0.61–1.24)
GFR calc Af Amer: 18 mL/min — ABNORMAL LOW
GFR calc non Af Amer: 16 mL/min — ABNORMAL LOW
Glucose, Bld: 159 mg/dL — ABNORMAL HIGH (ref 65–99)
Potassium: 4.3 mmol/L (ref 3.5–5.1)
Sodium: 127 mmol/L — ABNORMAL LOW (ref 135–145)

## 2016-10-17 LAB — TYPE AND SCREEN
ABO/RH(D): A POS
Antibody Screen: NEGATIVE
Unit division: 0

## 2016-10-17 MED ORDER — LIDOCAINE HCL (PF) 1 % IJ SOLN
INTRAMUSCULAR | Status: AC
  Filled 2016-10-17: qty 10

## 2016-10-17 NOTE — Procedures (Signed)
Ultrasound-guided diagnostic and therapeutic left thoracentesis performed yielding 0.85 liters of serosang colored fluid. No immediate complications. Follow-up chest x-ray pending.  Sua Spadafora E 3:42 PM 10/17/2016

## 2016-10-17 NOTE — Progress Notes (Signed)
  Echocardiogram 2D Echocardiogram has been performed.  Arvil ChacoFoster, Danarius Mcconathy 10/17/2016, 4:18 PM

## 2016-10-17 NOTE — Consult Note (Addendum)
Reason for Consult: pre-op clearance  - pt on [facility] -needs trach   Referring Physician: Dr. Laren Everts    PCP:  No primary care provider on file.  Primary Cardiologist:none here  Jesus Vega is an 83 y.o. male.    Chief Complaint: transferred from [facility] for dialysis and further care.     HPI:  9 yom with chronic respiratory failure on vent. Needing trach in near future.   He has a hx of dementia, acute renal failure, CKD stage IV  With CHF with EF 25-30% was [facility].  Now on HD.     Pt had been in normal health with dementia until Dec. 2017, with sigmoid volvus with necrotic colon requiring colectomy and colostomy.   During OR pt had NSTEMI- POST  VT/VFIB on the table and hypotension and asystole.  Epinephrine was administered and pt achieved NSR.   Pt with elevated troponin in ICU he had hypokalemia and hyponatremia.  CK MB 387/4.4  TROPNIN 0.205- note of troponin of 166 but I have not seen that lab value.  May have been 0 .166.  Pt went to SNF to rehab and readmitted in Jan for AMS and SOB with EF 25-30%.  Also with rising Cr.  Also with PNA and bil. Pl effusions requiring thoracentesis.   management of cholecystitis He had cholecystostomy tube.    Family wants aggressive care.  Dialysis was ordered on the 22nd of Jan.   Pt is a full code.  Pt now needs trach.  He has severe protein calorie malnutrition.   Currently on tube feeding, on levophed drip with hypotension, not responding due to ET tube but opens eyes when name called.    Tele her SR with occ PVC.  Past Medical History:  Diagnosis Date  . Cardiac arrest with ventricular fibrillation ([place]) on OR table   . CHF (congestive heart failure) ([place])   . Dementia   . Gout   . NSTEMI (non-ST elevated myocardial infarction) ([place])   . Pancreatitis   . Volvulus of sigmoid colon ([place])- necrotic colon 08/2016    Past Surgical History:  Procedure Laterality Date  . COLECTOMY    .  CT PERC CHOLECYSTOSTOMY    . IR GENERIC HISTORICAL  10/02/2016   IR FLUORO GUIDE CV LINE RIGHT 10/02/2016 Jacqulynn Cadet, MD [facility]-INTERV RAD  . IR GENERIC HISTORICAL  10/02/2016   IR US GUIDE VASC ACCESS RIGHT 10/02/2016 Jacqulynn Cadet, MD [facility]-INTER[facility]    Family History  Problem Relation Age of Onset  . Hypertension Mother    Social History:  reports that he has never smoked. He has never used smokeless tobacco. He reports that he does not drink alcohol or use drugs.  Allergies: No Known Allergies   CURRENT MEDICATIONS: Levophed drip Asa 81 mg daily Atorvastatin 40 mg daily pepcid 20 mg BID Ferrous sulfate 300 mg TID thera M plus 1 daily thiamine 100 mg daily vitamin d daily       Results for orders placed or performed during the hospital encounter of 09/30/16 (from the past 48 hour(s))  Blood gas, arterial     Status: None   Collection Time: 10/15/16  4:28 PM  Result Value Ref Range   FIO2 40.00    Delivery systems VENTILATOR    Mode ASSIST CONTROL    VT 500 mL   LHR 16 resp/min   Peep/cpap 5.0 cm H20   pH, Arterial  7.435 7.350 - 7.450   pCO2 arterial 37.9 32.0 - 48.0 mmHg   pO2, Arterial 92.4 83.0 - 108.0 mmHg   Bicarbonate 25.1 20.0 - 28.0 mmol/L   Acid-Base Excess 1.2 0.0 - 2.0 mmol/L   O2 Saturation 97.6 %   Patient temperature 98.0    Collection site RIGHT RADIAL    Drawn by COLLECTED BY RT    Sample type ARTERIAL DRAW    Allens test (pass/fail) PASS PASS  Blood gas, arterial     Status: Abnormal   Collection Time: 10/16/16  5:50 AM  Result Value Ref Range   FIO2 30.00    Delivery systems VENTILATOR    Mode ASSIST CONTROL    VT 500 mL   LHR 16 resp/min   Peep/cpap 5.0 cm H20   pH, Arterial 7.496 (H) 7.350 - 7.450   pCO2 arterial 29.6 (L) 32.0 - 48.0 mmHg   pO2, Arterial 112 (H) 83.0 - 108.0 mmHg   Bicarbonate 22.7 20.0 - 28.0 mmol/L   Acid-base deficit 0.3 0.0 - 2.0 mmol/L   O2 Saturation 98.7 %   Patient temperature 98.6    Collection site RIGHT  BRACHIAL    Drawn by BAMBI LIMA, RRT    Sample type ARTERIAL DRAW   Renal function panel     Status: Abnormal   Collection Time: 10/16/16  7:16 AM  Result Value Ref Range   Sodium 125 (L) 135 - 145 mmol/L   Potassium 4.5 3.5 - 5.1 mmol/L   Chloride 91 (L) 101 - 111 mmol/L   CO2 24 22 - 32 mmol/L   Glucose, Bld 165 (H) 65 - 99 mg/dL   BUN 64 (H) 6 - 20 mg/dL   Creatinine, Ser 4.46 (H) 0.61 - 1.24 mg/dL   Calcium 7.5 (L) 8.9 - 10.3 mg/dL   Phosphorus 1.5 (L) 2.5 - 4.6 mg/dL   Albumin 1.7 (L) 3.5 - 5.0 g/dL   GFR calc non Af Amer 11 (L) >60 mL/min   GFR calc Af Amer 13 (L) >60 mL/min    Comment: (NOTE) The eGFR has been calculated using the CKD EPI equation. This calculation has not been validated in all clinical situations. eGFR's persistently <60 mL/min signify possible Chronic Kidney Disease.    Anion gap 10 5 - 15  CBC     Status: Abnormal   Collection Time: 10/16/16  7:16 AM  Result Value Ref Range   WBC 13.1 (H) 4.0 - 10.5 K/uL    Comment: WHITE COUNT CONFIRMED ON SMEAR   RBC 2.53 (L) 4.22 - 5.81 MIL/uL   Hemoglobin 7.1 (L) 13.0 - 17.0 g/dL   HCT 22.6 (L) 39.0 - 52.0 %   MCV 89.3 78.0 - 100.0 fL   MCH 28.1 26.0 - 34.0 pg   MCHC 31.4 30.0 - 36.0 g/dL   RDW 16.1 (H) 11.5 - 15.5 %   Platelets 177 150 - 400 K/uL    Comment: PLATELET COUNT CONFIRMED BY SMEAR  CBC     Status: Abnormal   Collection Time: 10/16/16  3:39 PM  Result Value Ref Range   WBC 12.7 (H) 4.0 - 10.5 K/uL    Comment: WHITE COUNT CONFIRMED ON SMEAR   RBC 2.15 (L) 4.22 - 5.81 MIL/uL   Hemoglobin 6.1 (LL) 13.0 - 17.0 g/dL    Comment: REPEATED TO VERIFY CRITICAL RESULT CALLED TO, READ BACK BY AND VERIFIED WITH: A BAGWELL,RN 1555 10/16/16 D BRADLEY    HCT 19.0 (L) 39.0 - 52.0 %  MCV 88.4 78.0 - 100.0 fL   MCH 28.4 26.0 - 34.0 pg   MCHC 32.1 30.0 - 36.0 g/dL   RDW 16.0 (H) 11.5 - 15.5 %   Platelets 167 150 - 400 K/uL    Comment: PLATELET COUNT CONFIRMED BY SMEAR  Type and screen [place]     Status: None   Collection Time: 10/16/16  6:34 PM  Result Value Ref Range   ABO/RH(D) A POS    Antibody Screen NEG    Sample Expiration 10/19/2016    Unit Number [id number]    Blood Component Type RED CELLS,LR    Unit division 00    Status of Unit ISSUED,FINAL    Transfusion Status OK TO TRANSFUSE    Crossmatch Result Compatible   Prepare RBC     Status: None   Collection Time: 10/16/16  6:34 PM  Result Value Ref Range   Order Confirmation ORDER PROCESSED BY BLOOD BANK    Dg Chest Port 1 View  Result Date: 10/16/2016 CLINICAL DATA:  Respiratory failure, intubated patient, bilateral pleural effusions. EXAM: PORTABLE CHEST 1 VIEW COMPARISON:  Portable chest x-ray of October 15, 2016. FINDINGS: The lungs are reasonably well inflated. Bibasilar and mid lung densities persist. There are bilateral pleural effusions layering posteriorly obscuring the costophrenic angles. There is no pleural effusion. The cardiac silhouette is largely obscured. The pulmonary vascularity is not clearly engorged. The endotracheal tube tip lies 3.7 cm above the carina. The dual-lumen right internal jugular venous catheter tip projects over the distal third of the SVC. The esophagogastric tube tip projects below the inferior margin of the image. IMPRESSION: Persistent moderate to large bilateral pleural effusions with bibasilar atelectasis or pneumonia. No definite pulmonary edema. The support tubes are in reasonable position. Electronically Signed   By: David  Martinique M.D.   On: 10/16/2016 07:47   Dg Chest Port 1 View  Result Date: 10/15/2016 CLINICAL DATA:  Encounter for ET tube placement/repositioning. EXAM: PORTABLE CHEST 1 VIEW COMPARISON:  10/15/2016 FINDINGS: The endotracheal tube has been repositioned, tip estimated to be 3.6 cm above the carina. Nasogastric tube is in place, tip beyond the film, below the lower esophagus. Right subclavian line tip overlies the level of the lower superior vena cava.  Diffuse airspace filling opacities are noted bilaterally. Bilateral pleural effusions are present. IMPRESSION: Repositioned endotracheal tube. Bilateral lung opacities and pleural effusions. Electronically Signed   By: Nolon Nations M.D.   On: 10/15/2016 17:31   Dg Chest Port 1 View  Result Date: 10/15/2016 CLINICAL DATA:  Respiratory failure.  ETT tube placement. EXAM: PORTABLE CHEST 1 VIEW COMPARISON:  October 12, 2016 FINDINGS: The distal tip of the ET tube is 7 mm above the carina but does not extend down either mainstem bronchus. Recommend withdrawing 2 cm. A right central line is stable. No pneumothorax. Bilateral pleural effusions with underlying opacities, right greater than left. No change in the cardiomediastinal silhouette. IMPRESSION: 1. Low-lying ETT.  Recommend withdrawing 2 cm. 2. Bilateral pleural effusions, right greater than left, with underlying pulmonary opacities. These results will be called to the ordering clinician or representative by the Radiologist Assistant, and communication documented in the PACS or zVision Dashboard. Electronically Signed   By: Dorise Bullion III M.D   On: 10/15/2016 15:46    ROS:no family to ask and unable to obtain from pt.   VS T 100.7  P- 83, R 28, BP earlier today 116/66 and now 87/56  pulse ox 99%  Wt Readings from Last 3 Encounters:  No data found for Wt    PE: General: NAD opens eyes when name called intubated on vent with tube feeding  Skin:Warm and dry, brisk capillary refill HEENT:normocephalic, sclera clear, mucus membranes moist Neck:supple,has dressings on both sides of the neck  Heart:S1S2 RRR without murmur, gallup, rub or click Lungs: without rales, ++rhonchi, and wheezes YOM:AYOK, non tender, + BS, do not palpate liver spleen or masses Ext:no lower ext edema, 1+ pedal pulses, 2+ radial pulses Neuro:awake unable to answer questions  MAE  Assessment/Plan  Active Problems:   Acute renal failure (ARF) (HCC)   Respiratory  failure (HCC) on vent   Non-ST elevation (NSTEMI) myocardial infarction [facility])   Cardiac arrest with ventricular fibrillation ([place]) 08/2016   Cardiomyopathy ([place]), suspect systolic    Asked to see pt for pre-op exam and clearance for trach.  He has known cardiac arrest with surgery in 08/2016 with V fib arrest and asystole.  EF 25%.  I could not find echo report.  Recent Hgb is 6.1 yesterday , K+ 4.5,   Will check EKG and leave possible repeat echo to Dr. Claiborne Billings.  No hx of ischemic work up- pt appears critically ill on levophed drip.  Dr. Claiborne Billings to see.     Addendum:  EKG SR with PVC and poor to no R wave progression - old Ant MI will check Echo.    Cecilie Kicks  Nurse Practitioner Certified [place] Pager [phone] or after 5pm or weekends call [phone] 10/17/2016, 1:20 PM     Patient seen and examined. Agree with assessment and plan. Pt is currently on ventilator in need for tracheostomy currently scheduled for tomorrow. He suffered a VF arrest in Dec 2017 and has been documented to have an EF of 25% suggestive of an ischemic cardiomyopathy. He has ESRD on dialysis.  BP now is 110/70 on levophed drip at 8, had decreased to upper 70s to low 80s resulting in initiation of levophed for presumed multifactorial and cardiogenic shock.  No reported chest pain. ECG independently reviewed by me shows NSR with isolated PVC and suggests prior anterolateral MI with QS V4-6,  I and aVL; and has small nondiagnostic inferior Q waves.  Would repeat echo today to re-assess LV function prior to planned procedure. H/H yesterday 7.1/22.6  and H/H 6.1/ 19 . Would repeat and consider transfusion of 1 unit prior to procedure if Hb remains <7.   Troy Sine, MD, [facility] 10/17/2016 2:28 PM

## 2016-10-18 DIAGNOSIS — N179 Acute kidney failure, unspecified: Secondary | ICD-10-CM

## 2016-10-18 DIAGNOSIS — I255 Ischemic cardiomyopathy: Secondary | ICD-10-CM

## 2016-10-18 LAB — RENAL FUNCTION PANEL
ALBUMIN: 1.5 g/dL — AB (ref 3.5–5.0)
Anion gap: 10 (ref 5–15)
BUN: 52 mg/dL — AB (ref 6–20)
CALCIUM: 7.2 mg/dL — AB (ref 8.9–10.3)
CO2: 22 mmol/L (ref 22–32)
Chloride: 91 mmol/L — ABNORMAL LOW (ref 101–111)
Creatinine, Ser: 3.7 mg/dL — ABNORMAL HIGH (ref 0.61–1.24)
GFR calc Af Amer: 16 mL/min — ABNORMAL LOW (ref >60)
GFR calc non Af Amer: 14 mL/min — ABNORMAL LOW (ref >60)
GLUCOSE: 198 mg/dL — AB (ref 65–99)
Phosphorus: 1 mg/dL — CL (ref 2.5–4.6)
Potassium: 4 mmol/L (ref 3.5–5.1)
SODIUM: 123 mmol/L — AB (ref 135–145)

## 2016-10-18 NOTE — Progress Notes (Signed)
Progress Note  Patient Name: SHAYAN BRAMHALL Date of Encounter: 10/18/2016  Primary Cardiologist: (None)  Subjective   Remains intubated, on pressors.   Inpatient Medications    Scheduled Meds:  Levophed drip Asa 81 mg daily Atorvastatin 40 mg daily pepcid 20 mg BID Ferrous sulfate 300 mg TID thera M plus 1 daily thiamine 100 mg daily vitamin d daily  Vital Signs    T: 99.6 HR: 89 RR:24 BP:133/68   Telemetry    SR - Personally Reviewed  ECG    SR with old anterolateral infarct - Personally Reviewed  Physical Exam   GEN: Remains intubated, on levophed drip Neck: No JVD Cardiac: RRR, no murmurs, rubs, or gallops.  Respiratory: Clear to auscultation bilaterally. GI: Soft, nontender, non-distended  MS: No edema; No deformity. Neuro:  Intubated  Labs    Chemistry Recent Labs Lab 10/13/16 0845 10/16/16 0716 10/17/16 1627 10/18/16 0833  NA 130* 125* 127* 123*  K 4.3 4.5 4.3 4.0  CL 97* 91* 92* 91*  CO2 23 24 25 22   GLUCOSE 148* 165* 159* 198*  BUN 51* 64* 44* 52*  CREATININE 4.07* 4.46* 3.33* 3.70*  CALCIUM 7.9* 7.5* 7.4* 7.2*  ALBUMIN 1.8* 1.7*  --  1.5*  GFRNONAA 12* 11* 16* 14*  GFRAA 14* 13* 18* 16*  ANIONGAP 10 10 10 10      Hematology Recent Labs Lab 10/16/16 0716 10/16/16 1539 10/17/16 1627  WBC 13.1* 12.7* 17.9*  RBC 2.53* 2.15* 2.84*  HGB 7.1* 6.1* 8.0*  HCT 22.6* 19.0* 25.0*  MCV 89.3 88.4 88.0  MCH 28.1 28.4 28.2  MCHC 31.4 32.1 32.0  RDW 16.1* 16.0* 15.8*  PLT 177 167 197    Cardiac EnzymesNo results for input(s): TROPONINI in the last 168 hours. No results for input(s): TROPIPOC in the last 168 hours.   BNPNo results for input(s): BNP, PROBNP in the last 168 hours.   DDimer No results for input(s): DDIMER in the last 168 hours.   Radiology    Dg Chest Port 1 View  Result Date: 10/17/2016 CLINICAL DATA:  Myocardial infarct. Cardiomyopathy. Acute renal failure. Left pleural effusion status post thoracentesis. EXAM:  PORTABLE CHEST 1 VIEW COMPARISON:  10/16/2016 FINDINGS: Support lines and tubes in appropriate position. Interval decrease in size of left pleural effusion is seen since prior study. No pneumothorax visualized. Moderate layering right pleural effusion shows no significant change. Cardiomegaly stable. Bibasilar atelectasis noted as well as perihilar airspace disease bilaterally, suspicious for pulmonary edema. IMPRESSION: Decreased size of left pleural effusion. No pneumothorax visualized. Stable cardiomegaly and bilateral perihilar airspace disease/edema. Stable moderate layering right pleural effusion. Electronically Signed   By: Myles Rosenthal M.D.   On: 10/17/2016 17:03   US Thoracentesis Asp Pleural Space W/img Guide  Result Date: 10/17/2016 INDICATION: Ventilator dependent respiratory failure with bilateral pleural effusions. Request is made for therapeutic thoracentesis on the side with the most fluid. EXAM: ULTRASOUND GUIDED THERAPEUTIC THORACENTESIS MEDICATIONS: 1% lidocaine. COMPLICATIONS: None immediate. PROCEDURE: An ultrasound guided thoracentesis was thoroughly discussed with the patient's wife and questions answered. The benefits, risks, alternatives and complications were also discussed. The patient's wife understands and wishes to proceed with the procedure. Verbal phone consent was obtained. Ultrasound was performed to localize and mark an adequate pocket of fluid in the left chest. The area was then prepped and draped in the normal sterile fashion. 1% Lidocaine was used for local anesthesia. Under ultrasound guidance a Safe-T-Centesis catheter was introduced. Thoracentesis was performed. The catheter  was removed and a dressing applied. FINDINGS: A total of approximately 0.85 L of serosanguineous fluid was removed. IMPRESSION: Successful ultrasound guided left thoracentesis yielding 0.85 L of pleural fluid. Read by: Barnetta ChapelKelly Osborne, PA-C Electronically Signed   By: Simonne ComeJohn  Watts M.D.   On: 10/17/2016  15:48    Cardiac Studies   TTE: 10/17/16  Study Conclusions  - Left ventricle: The cavity size was normal. There was mild focal   basal hypertrophy of the septum. Systolic function was severely   reduced. The estimated ejection fraction was in the range of 20%   to 25%. There is akinesis of the mid-apicalanteroseptal,   inferior, and apical myocardium. The study is not technically   sufficient to allow evaluation of LV diastolic function. - Aortic valve: Trileaflet; moderately thickened, mildly calcified   leaflets. - Mitral valve: There was mild regurgitation. - Left atrium: The atrium was mildly dilated. - Tricuspid valve: There was moderate regurgitation. - Pulmonary arteries: PA peak pressure: 56 mm Hg (S).   Patient Profile     83 y.o. male with PMH of dementia, acute renal failure, CKD stage IV with CHF with EF 25-30% [facility]  Now on HD. Underwent GI surgery in 12/17 and had NSTEMI/VT-VF while in the OR.   Assessment & Plan    Active Problems:   Acute renal failure (ARF) (HCC)   Respiratory failure (HCC) on vent   Non-ST elevation (NSTEMI) myocardial infarction [facility](HCC)   Cardiac arrest with ventricular fibrillation (HCC) 08/2016   Cardiomyopathy (HCC), suspect systolic   Seen yesterday for preop cardiac evaluation for trach placement. Known cardiac arrest with surgery in 08/2016 with V fib arrest and asystole.  EF 25%. Follow up echo yesterday showed EF 20-25% and akinesis of the mid-apicalanteroseptal, inferior, and apical myocardium, suggestive of LAD territory. EKG yesterday showed SR with old anterolateral infarct and PVC.  --remains critically ill, levophed now at 10mcgs, up from 8mcgs yesterday. Per notes family wants aggressive measurements. Given finding he is high risk for any surgical intervention.   Signed, Laverda PageLindsay Roberts, NP  10/18/2016, 1:36 PM     Patient seen and examined. Agree with assessment and plan. Currently  undergoing dialysis on levophed today at 10 with BP on dialysis 120 - 130 range.  Echo data reviewed and suggests an ischemic cardiomyopathy with LAD territory infarct. EF 20 - 25%. BP on inotropic therapy precluding CHF meds such as ACE-I/ARB, spironolactone/coreg.  Would try to reduce levophed as BP allows to kee BP > 110 if possible and if successful can try future initiation of RX.  Pt is obviously high risk for surgery, but should be able to tolerate trach with close hemodynamic monitoring and support if necessary.   Lennette Biharihomas A. Dontae Minerva, MD, [facility] 10/18/2016 1:55 PM

## 2016-10-18 NOTE — Progress Notes (Signed)
[facility]  ROUNDING NOTE   Subjective:  Patient seen and evaluated during hemodialysis. Tolerating well. Blood pressure better today.   Objective:  Vital signs in last 24 hours:  Temperature 95.3 pulse 80 respirations 25 blood pressure 110/60  Physical Exam: General: Critically ill-appearing  Head: ETT in place, NG in place  Eyes: Anicteric  Neck: Supple, trachea midline  Lungs:  Scattered rhonchi, on vent  Heart: S1S2 no rubs  Abdomen:  Soft, nontender, bowel sounds present, colostomy in pace  Extremities: ++ dependent edema  Neurologic: not following commands  Skin: No lesions  Access: R IJ temp HD catheter    Basic Metabolic Panel:  Recent Labs Lab 10/13/16 0845 10/16/16 0716 10/17/16 1627 10/18/16 0833  NA 130* 125* 127* 123*  K 4.3 4.5 4.3 4.0  CL 97* 91* 92* 91*  CO2 23 24 25 22   GLUCOSE 148* 165* 159* 198*  BUN 51* 64* 44* 52*  CREATININE 4.07* 4.46* 3.33* 3.70*  CALCIUM 7.9* 7.5* 7.4* 7.2*  PHOS 3.2 1.5*  --  1.0*    Liver Function Tests:  Recent Labs Lab 10/13/16 0845 10/16/16 0716 10/18/16 0833  ALBUMIN 1.8* 1.7* 1.5*   No results for input(s): LIPASE, AMYLASE in the last 168 hours. No results for input(s): AMMONIA in the last 168 hours.  CBC:  Recent Labs Lab 10/13/16 0845 10/16/16 0716 10/16/16 1539 10/17/16 1627  WBC 14.1* 13.1* 12.7* 17.9*  HGB 7.7* 7.1* 6.1* 8.0*  HCT 24.3* 22.6* 19.0* 25.0*  MCV 90.0 89.3 88.4 88.0  PLT 192 177 167 197    Cardiac Enzymes: No results for input(s): CKTOTAL, CKMB, CKMBINDEX, TROPONINI in the last 168 hours.  BNP: Invalid input(s): POCBNP  CBG: No results for input(s): GLUCAP in the last 168 hours.  Microbiology: Results for orders placed or performed during the hospital encounter of 09/30/16  Culture, blood (routine x 2)     Status: None   Collection Time: 10/06/16  4:19 PM  Result Value Ref Range Status   Specimen Description BLOOD HEMODIALYSIS CATHETER  Final   Special Requests BOTTLES DRAWN AEROBIC ONLY 5CC  Final   Culture NO GROWTH 5 DAYS  Final   Report Status 10/11/2016 FINAL  Final  Culture, blood (routine x 2)     Status: None   Collection Time: 10/06/16  4:20 PM  Result Value Ref Range Status   Specimen Description BLOOD HEMODIALYSIS CATHETER  Final   Special Requests BOTTLES DRAWN AEROBIC ONLY 10CC  Final   Culture NO GROWTH 5 DAYS  Final   Report Status 10/11/2016 FINAL  Final    Coagulation Studies: No results for input(s): LABPROT, INR in the last 72 hours.  Urinalysis: No results for input(s): COLORURINE, LABSPEC, PHURINE, GLUCOSEU, HGBUR, BILIRUBINUR, KETONESUR, PROTEINUR, UROBILINOGEN, NITRITE, LEUKOCYTESUR in the last 72 hours.  Invalid input(s): APPERANCEUR    Imaging: Dg Chest Port 1 View  Result Date: 10/17/2016 CLINICAL DATA:  Myocardial infarct. Cardiomyopathy. Acute renal failure. Left pleural effusion status post thoracentesis. EXAM: PORTABLE CHEST 1 VIEW COMPARISON:  10/16/2016 FINDINGS: Support lines and tubes in appropriate position. Interval decrease in size of left pleural effusion is seen since prior study. No pneumothorax visualized. Moderate layering right pleural effusion shows no significant change. Cardiomegaly stable. Bibasilar atelectasis noted as well as perihilar airspace disease bilaterally, suspicious for pulmonary edema. IMPRESSION: Decreased size of left pleural effusion. No pneumothorax visualized. Stable cardiomegaly and bilateral perihilar airspace disease/edema. Stable moderate layering right pleural effusion. Electronically Signed   By:  Myles RosenthalJohn  Stahl M.D.   On: 10/17/2016 17:03   [place] Thoracentesis Asp Pleural Space W/img Guide  Result Date: 10/17/2016 INDICATION: Ventilator dependent respiratory failure with bilateral pleural effusions. Request is made for therapeutic thoracentesis on the side with the most fluid. EXAM: ULTRASOUND GUIDED THERAPEUTIC THORACENTESIS MEDICATIONS: 1% lidocaine. COMPLICATIONS:  None immediate. PROCEDURE: An ultrasound guided thoracentesis was thoroughly discussed with the patient's wife and questions answered. The benefits, risks, alternatives and complications were also discussed. The patient's wife understands and wishes to proceed with the procedure. Verbal phone consent was obtained. Ultrasound was performed to localize and mark an adequate pocket of fluid in the left chest. The area was then prepped and draped in the normal sterile fashion. 1% Lidocaine was used for local anesthesia. Under ultrasound guidance a Safe-T-Centesis catheter was introduced. Thoracentesis was performed. The catheter was removed and a dressing applied. FINDINGS: A total of approximately 0.85 L of serosanguineous fluid was removed. IMPRESSION: Successful ultrasound guided left thoracentesis yielding 0.85 L of pleural fluid. Read by: Barnetta ChapelKelly Osborne, PA-C Electronically Signed   By: Simonne ComeJohn  Watts M.D.   On: 10/17/2016 15:48     Medications:       Assessment/ Plan:  83 y.o.  African American male with a PMHx of dementia, chronic kidney disease stage IV, congestive heart failure ejection fraction 25-30%, who was admitted to [facility] on 09/30/2016 for ongoing management of cholecystitis and volvulus status post exploratory laparotomy with colectomy. [place].  1.  Acute renal failure. 2.  CKD stage IV. 3.  Acute respiratory failure. 4.  Metabolic acidosis.    Plan:  Overall patient remains critically ill. He is seen and evaluated during hemodialysis. He appears to be tolerating this well. Blood pressure is better today at 110/60. We will continue to use albumin during dialysis to help support blood pressure. We will need to continue monitor electrolytes and serum sodium was a bit low at 123 today.  In addition phosphorus was very low at 1.0. Therefore we have ordered sodium phosphorus 20 mmol IV x1. If phosphorus remains less than 2 tomorrow we will  repeat the dose. Overall guarded prognosis.   LOS: 0 Gwendolen Hewlett 2/7/20183:26 PM

## 2016-10-19 LAB — RENAL FUNCTION PANEL
Albumin: 1.5 g/dL — ABNORMAL LOW (ref 3.5–5.0)
Anion gap: 10 (ref 5–15)
BUN: 34 mg/dL — ABNORMAL HIGH (ref 6–20)
CALCIUM: 7.1 mg/dL — AB (ref 8.9–10.3)
CHLORIDE: 91 mmol/L — AB (ref 101–111)
CO2: 25 mmol/L (ref 22–32)
CREATININE: 2.69 mg/dL — AB (ref 0.61–1.24)
GFR, EST AFRICAN AMERICAN: 24 mL/min — AB (ref >60)
GFR, EST NON AFRICAN AMERICAN: 21 mL/min — AB (ref >60)
Glucose, Bld: 192 mg/dL — ABNORMAL HIGH (ref 65–99)
Phosphorus: 1.9 mg/dL — ABNORMAL LOW (ref 2.5–4.6)
Potassium: 3.8 mmol/L (ref 3.5–5.1)
Sodium: 126 mmol/L — ABNORMAL LOW (ref 135–145)

## 2016-10-20 LAB — CBC
HEMATOCRIT: 25.6 % — AB (ref 39.0–52.0)
HEMOGLOBIN: 8.2 g/dL — AB (ref 13.0–17.0)
MCH: 28 pg (ref 26.0–34.0)
MCHC: 32 g/dL (ref 30.0–36.0)
MCV: 87.4 fL (ref 78.0–100.0)
Platelets: 208 10*3/uL (ref 150–400)
RBC: 2.93 MIL/uL — AB (ref 4.22–5.81)
RDW: 15.8 % — ABNORMAL HIGH (ref 11.5–15.5)
WBC: 13.6 10*3/uL — ABNORMAL HIGH (ref 4.0–10.5)

## 2016-10-20 LAB — RENAL FUNCTION PANEL
ANION GAP: 13 (ref 5–15)
Albumin: 1.4 g/dL — ABNORMAL LOW (ref 3.5–5.0)
BUN: 47 mg/dL — ABNORMAL HIGH (ref 6–20)
CALCIUM: 7.2 mg/dL — AB (ref 8.9–10.3)
CO2: 21 mmol/L — AB (ref 22–32)
Chloride: 89 mmol/L — ABNORMAL LOW (ref 101–111)
Creatinine, Ser: 3.33 mg/dL — ABNORMAL HIGH (ref 0.61–1.24)
GFR calc non Af Amer: 16 mL/min — ABNORMAL LOW (ref >60)
GFR, EST AFRICAN AMERICAN: 18 mL/min — AB (ref >60)
Glucose, Bld: 180 mg/dL — ABNORMAL HIGH (ref 65–99)
Phosphorus: 1.4 mg/dL — ABNORMAL LOW (ref 2.5–4.6)
Potassium: 4.2 mmol/L (ref 3.5–5.1)
SODIUM: 123 mmol/L — AB (ref 135–145)

## 2016-10-20 NOTE — Progress Notes (Signed)
[facility]  ROUNDING NOTE   Subjective:  Patient seen and evaluated during hemodialysis. He appears to be tolerating this well at the moment. Remains on the ventilator at this point in time.   Objective:  Vital signs in last 24 hours:  Temperature 97.8 pulse 85 respirations 25 blood pressure 100/65  Physical Exam: General: Critically ill-appearing  Head: ETT in place, NG in place, nasal skin erosions noted  Eyes: Anicteric  Neck: Supple, trachea midline  Lungs:  Scattered rhonchi, on vent  Heart: S1S2 no rubs  Abdomen:  Soft, nontender, bowel sounds present, colostomy in pace  Extremities: ++ dependent edema  Neurologic: not following commands  Skin: No lesions  Access: R IJ temp HD catheter    Basic Metabolic Panel:  Recent Labs Lab 10/16/16 0716 10/17/16 1627 10/18/16 0833 10/19/16 0552 10/20/16 0632  NA 125* 127* 123* 126* 123*  K 4.5 4.3 4.0 3.8 4.2  CL 91* 92* 91* 91* 89*  CO2 24 25 22 25  21*  GLUCOSE 165* 159* 198* 192* 180*  BUN 64* 44* 52* 34* 47*  CREATININE 4.46* 3.33* 3.70* 2.69* 3.33*  CALCIUM 7.5* 7.4* 7.2* 7.1* 7.2*  PHOS 1.5*  --  1.0* 1.9* 1.4*    Liver Function Tests:  Recent Labs Lab 10/16/16 0716 10/18/16 0833 10/19/16 0552 10/20/16 0632  ALBUMIN 1.7* 1.5* 1.5* 1.4*   No results for input(s): LIPASE, AMYLASE in the last 168 hours. No results for input(s): AMMONIA in the last 168 hours.  CBC:  Recent Labs Lab 10/16/16 0716 10/16/16 1539 10/17/16 1627 10/20/16 0632  WBC 13.1* 12.7* 17.9* 13.6*  HGB 7.1* 6.1* 8.0* 8.2*  HCT 22.6* 19.0* 25.0* 25.6*  MCV 89.3 88.4 88.0 87.4  PLT 177 167 197 208    Cardiac Enzymes: No results for input(s): CKTOTAL, CKMB, CKMBINDEX, TROPONINI in the last 168 hours.  BNP: Invalid input(s): POCBNP  CBG: No results for input(s): GLUCAP in the last 168 hours.  Microbiology: Results for orders placed or performed during the hospital encounter of 09/30/16  Culture, blood  (routine x 2)     Status: None   Collection Time: 10/06/16  4:19 PM  Result Value Ref Range Status   Specimen Description BLOOD HEMODIALYSIS CATHETER  Final   Special Requests BOTTLES DRAWN AEROBIC ONLY 5CC  Final   Culture NO GROWTH 5 DAYS  Final   Report Status 10/11/2016 FINAL  Final  Culture, blood (routine x 2)     Status: None   Collection Time: 10/06/16  4:20 PM  Result Value Ref Range Status   Specimen Description BLOOD HEMODIALYSIS CATHETER  Final   Special Requests BOTTLES DRAWN AEROBIC ONLY 10CC  Final   Culture NO GROWTH 5 DAYS  Final   Report Status 10/11/2016 FINAL  Final    Coagulation Studies: No results for input(s): LABPROT, INR in the last 72 hours.  Urinalysis: No results for input(s): COLORURINE, LABSPEC, PHURINE, GLUCOSEU, HGBUR, BILIRUBINUR, KETONESUR, PROTEINUR, UROBILINOGEN, NITRITE, LEUKOCYTESUR in the last 72 hours.  Invalid input(s): APPERANCEUR    Imaging: No results found.   Medications:       Assessment/ Plan:  83 y.o.  African American male with a PMHx of dementia, chronic kidney disease stage IV, congestive heart failure ejection fraction 25-30%, who was admitted to [facility] on 09/30/2016 for ongoing management of cholecystitis and volvulus status post exploratory laparotomy with colectomy. Patient was previously in [place].  1.  Acute renal failure. 2.  CKD stage IV. 3.  Acute respiratory failure. 4.  Metabolic acidosis.    Plan:  Patient seen and evaluated during hemodialysis. He appears to be tolerating this well at the moment. Unclear if he will approach end-stage renal disease this admission. Phosphorus remains a bit low at 1.4. We will provide him with sodium phosphorus repletion again today. Otherwise continue ventilatory support. Overall prognosis appears quite guarded.   LOS: 0 Recie Cirrincione 2/9/20182:40 PM

## 2016-10-22 LAB — CBC
HCT: 26 % — ABNORMAL LOW (ref 39.0–52.0)
Hemoglobin: 8.5 g/dL — ABNORMAL LOW (ref 13.0–17.0)
MCH: 28.5 pg (ref 26.0–34.0)
MCHC: 32.7 g/dL (ref 30.0–36.0)
MCV: 87.2 fL (ref 78.0–100.0)
Platelets: 258 10*3/uL (ref 150–400)
RBC: 2.98 MIL/uL — AB (ref 4.22–5.81)
RDW: 15.7 % — ABNORMAL HIGH (ref 11.5–15.5)
WBC: 19.2 10*3/uL — ABNORMAL HIGH (ref 4.0–10.5)

## 2016-10-22 LAB — COMPREHENSIVE METABOLIC PANEL
ALBUMIN: 1.5 g/dL — AB (ref 3.5–5.0)
ALT: 113 U/L — AB (ref 17–63)
ANION GAP: 11 (ref 5–15)
AST: 211 U/L — ABNORMAL HIGH (ref 15–41)
Alkaline Phosphatase: 157 U/L — ABNORMAL HIGH (ref 38–126)
BUN: 49 mg/dL — ABNORMAL HIGH (ref 6–20)
CO2: 23 mmol/L (ref 22–32)
CREATININE: 3.06 mg/dL — AB (ref 0.61–1.24)
Calcium: 7.2 mg/dL — ABNORMAL LOW (ref 8.9–10.3)
Chloride: 91 mmol/L — ABNORMAL LOW (ref 101–111)
GFR calc non Af Amer: 18 mL/min — ABNORMAL LOW (ref >60)
GFR, EST AFRICAN AMERICAN: 20 mL/min — AB (ref >60)
GLUCOSE: 229 mg/dL — AB (ref 65–99)
Potassium: 4.1 mmol/L (ref 3.5–5.1)
SODIUM: 125 mmol/L — AB (ref 135–145)
Total Bilirubin: 0.4 mg/dL (ref 0.3–1.2)
Total Protein: 6.4 g/dL — ABNORMAL LOW (ref 6.5–8.1)

## 2016-10-22 LAB — CORTISOL: CORTISOL PLASMA: 20.6 ug/dL

## 2016-10-22 LAB — URIC ACID: Uric Acid, Serum: 3.5 mg/dL — ABNORMAL LOW (ref 4.4–7.6)

## 2016-10-22 LAB — TSH: TSH: 2.288 u[IU]/mL (ref 0.350–4.500)

## 2016-10-22 LAB — OSMOLALITY: OSMOLALITY: 292 mosm/kg (ref 275–295)

## 2016-10-23 ENCOUNTER — Other Ambulatory Visit (HOSPITAL_COMMUNITY): Payer: Medicare Other

## 2016-10-23 ENCOUNTER — Institutional Professional Consult (permissible substitution) (HOSPITAL_COMMUNITY): Payer: Medicare Other

## 2016-10-23 ENCOUNTER — Encounter: Payer: Self-pay | Admitting: Radiology

## 2016-10-23 LAB — HEPATIC FUNCTION PANEL
ALK PHOS: 174 U/L — AB (ref 38–126)
ALK PHOS: 139 U/L — AB (ref 38–126)
ALT: 135 U/L — AB (ref 17–63)
ALT: 113 U/L — ABNORMAL HIGH (ref 17–63)
AST: 240 U/L — AB (ref 15–41)
AST: 193 U/L — ABNORMAL HIGH (ref 15–41)
Albumin: 1.4 g/dL — ABNORMAL LOW (ref 3.5–5.0)
Albumin: 1.6 g/dL — ABNORMAL LOW (ref 3.5–5.0)
BILIRUBIN DIRECT: 0.2 mg/dL (ref 0.1–0.5)
BILIRUBIN DIRECT: 0.1 mg/dL (ref 0.1–0.5)
BILIRUBIN INDIRECT: 0.5 mg/dL (ref 0.3–0.9)
BILIRUBIN TOTAL: 0.8 mg/dL (ref 0.3–1.2)
BILIRUBIN TOTAL: 0.6 mg/dL (ref 0.3–1.2)
Indirect Bilirubin: 0.6 mg/dL (ref 0.3–0.9)
Total Protein: 7.3 g/dL (ref 6.5–8.1)
Total Protein: 5.7 g/dL — ABNORMAL LOW (ref 6.5–8.1)

## 2016-10-23 LAB — BASIC METABOLIC PANEL
ANION GAP: 10 (ref 5–15)
BUN: 61 mg/dL — AB (ref 6–20)
CHLORIDE: 90 mmol/L — AB (ref 101–111)
CO2: 23 mmol/L (ref 22–32)
CREATININE: 3.47 mg/dL — AB (ref 0.61–1.24)
Calcium: 7.2 mg/dL — ABNORMAL LOW (ref 8.9–10.3)
GFR calc non Af Amer: 15 mL/min — ABNORMAL LOW (ref >60)
GFR, EST AFRICAN AMERICAN: 17 mL/min — AB (ref >60)
GLUCOSE: 179 mg/dL — AB (ref 65–99)
POTASSIUM: 4.7 mmol/L (ref 3.5–5.1)
Sodium: 123 mmol/L — ABNORMAL LOW (ref 135–145)

## 2016-10-23 LAB — CBC
HEMATOCRIT: 30.1 % — AB (ref 39.0–52.0)
Hemoglobin: 9.8 g/dL — ABNORMAL LOW (ref 13.0–17.0)
MCH: 28.7 pg (ref 26.0–34.0)
MCHC: 32.6 g/dL (ref 30.0–36.0)
MCV: 88.3 fL (ref 78.0–100.0)
PLATELETS: 261 10*3/uL (ref 150–400)
RBC: 3.41 MIL/uL — AB (ref 4.22–5.81)
RDW: 16 % — AB (ref 11.5–15.5)
WBC: 20 10*3/uL — ABNORMAL HIGH (ref 4.0–10.5)

## 2016-10-23 LAB — PROTIME-INR
INR: 1.65
PROTHROMBIN TIME: 19.7 s — AB (ref 11.4–15.2)

## 2016-10-23 LAB — PHOSPHORUS: Phosphorus: 1.4 mg/dL — ABNORMAL LOW (ref 2.5–4.6)

## 2016-10-23 MED ORDER — IOPAMIDOL (ISOVUE-300) INJECTION 61%: 75.0000 mL | Freq: Once | INTRAVENOUS | Status: DC | PRN

## 2016-10-23 NOTE — Progress Notes (Signed)
Patient down for procedure. He underwent hemodialysis today. We will prepare dialysis orders for Wednesday.

## 2016-10-25 LAB — CBC WITH DIFFERENTIAL/PLATELET
BAND NEUTROPHILS: 3 %
BASOS ABS: 0 10*3/uL (ref 0.0–0.1)
BASOS PCT: 0 %
Blasts: 0 %
EOS ABS: 0 10*3/uL (ref 0.0–0.7)
EOS PCT: 0 %
HCT: 23.3 % — ABNORMAL LOW (ref 39.0–52.0)
HEMOGLOBIN: 7.6 g/dL — AB (ref 13.0–17.0)
LYMPHS ABS: 0.2 10*3/uL — AB (ref 0.7–4.0)
LYMPHS PCT: 1 %
MCH: 28.6 pg (ref 26.0–34.0)
MCHC: 32.6 g/dL (ref 30.0–36.0)
MCV: 87.6 fL (ref 78.0–100.0)
METAMYELOCYTES PCT: 0 %
MONO ABS: 1.3 10*3/uL — AB (ref 0.1–1.0)
MONOS PCT: 7 %
Myelocytes: 0 %
NEUTROS ABS: 17.3 10*3/uL — AB (ref 1.7–7.7)
Neutrophils Relative %: 89 %
OTHER: 0 %
Platelets: 269 10*3/uL (ref 150–400)
Promyelocytes Absolute: 0 %
RBC: 2.66 MIL/uL — ABNORMAL LOW (ref 4.22–5.81)
RDW: 16.4 % — AB (ref 11.5–15.5)
WBC: 18.8 10*3/uL — ABNORMAL HIGH (ref 4.0–10.5)
nRBC: 0 /100 WBC

## 2016-10-25 LAB — RENAL FUNCTION PANEL
ALBUMIN: 1.3 g/dL — AB (ref 3.5–5.0)
ANION GAP: 11 (ref 5–15)
ANION GAP: 10 (ref 5–15)
Albumin: 1.4 g/dL — ABNORMAL LOW (ref 3.5–5.0)
BUN: 60 mg/dL — AB (ref 6–20)
BUN: 57 mg/dL — AB (ref 6–20)
CALCIUM: 7.3 mg/dL — AB (ref 8.9–10.3)
CO2: 20 mmol/L — AB (ref 22–32)
CO2: 24 mmol/L (ref 22–32)
Calcium: 7.1 mg/dL — ABNORMAL LOW (ref 8.9–10.3)
Chloride: 94 mmol/L — ABNORMAL LOW (ref 101–111)
Chloride: 93 mmol/L — ABNORMAL LOW (ref 101–111)
Creatinine, Ser: 3.35 mg/dL — ABNORMAL HIGH (ref 0.61–1.24)
Creatinine, Ser: 3.21 mg/dL — ABNORMAL HIGH (ref 0.61–1.24)
GFR calc Af Amer: 18 mL/min — ABNORMAL LOW (ref >60)
GFR calc Af Amer: 19 mL/min — ABNORMAL LOW (ref >60)
GFR calc non Af Amer: 16 mL/min — ABNORMAL LOW (ref >60)
GFR calc non Af Amer: 17 mL/min — ABNORMAL LOW (ref >60)
GLUCOSE: 180 mg/dL — AB (ref 65–99)
GLUCOSE: 190 mg/dL — AB (ref 65–99)
POTASSIUM: 6.6 mmol/L — AB (ref 3.5–5.1)
Phosphorus: 2.6 mg/dL (ref 2.5–4.6)
Phosphorus: 2.1 mg/dL — ABNORMAL LOW (ref 2.5–4.6)
Potassium: 4.2 mmol/L (ref 3.5–5.1)
SODIUM: 127 mmol/L — AB (ref 135–145)
Sodium: 125 mmol/L — ABNORMAL LOW (ref 135–145)

## 2016-10-25 NOTE — Progress Notes (Signed)
[facility]  ROUNDING NOTE   Subjective:  Patient underwent hemodialysis earlier today. He tolerated this well. Ultrafiltration target was 1.5 kg. Overall patient remains critically ill at this point in time.    Objective:  Vital signs in last 24 hours:  Temperature 96.9 pulse 93 respirations 25 blood pressure 104/61  Physical Exam: General: Critically ill-appearing  Head: ETT in place, NG in place, nasal skin erosions noted  Eyes: Anicteric  Neck: Supple, trachea midline  Lungs:  Scattered rhonchi, on vent  Heart: S1S2 no rubs  Abdomen:  Soft, nontender, bowel sounds present, colostomy in place  Extremities: ++ dependent edema  Neurologic: not following commands  Skin: No lesions  Access: R IJ temp HD catheter    Basic Metabolic Panel:  Recent Labs Lab 10/19/16 0552 10/20/16 0632 10/22/16 0830 10/23/16 0633 10/25/16 0539 10/25/16 0920  NA 126* 123* 125* 123* 125* 127*  K 3.8 4.2 4.1 4.7 6.6* 4.2  CL 91* 89* 91* 90* 94* 93*  CO2 25 21* 23 23 20* 24  GLUCOSE 192* 180* 229* 179* 180* 190*  BUN 34* 47* 49* 61* 60* 57*  CREATININE 2.69* 3.33* 3.06* 3.47* 3.35* 3.21*  CALCIUM 7.1* 7.2* 7.2* 7.2* 7.1* 7.3*  PHOS 1.9* 1.4*  --  1.4* 2.6 2.1*    Liver Function Tests:  Recent Labs Lab 10/22/16 0830 10/23/16 0633 10/23/16 1400 10/25/16 0539 10/25/16 0920  AST 211* 240* 193*  --   --   ALT 113* 135* 113*  --   --   ALKPHOS 157* 174* 139*  --   --   BILITOT 0.4 0.8 0.6  --   --   PROT 6.4* 7.3 5.7*  --   --   ALBUMIN 1.5* 1.4* 1.6* 1.3* 1.4*   No results for input(s): LIPASE, AMYLASE in the last 168 hours. No results for input(s): AMMONIA in the last 168 hours.  CBC:  Recent Labs Lab 10/20/16 0632 10/22/16 0830 10/23/16 0633 10/25/16 0820  WBC 13.6* 19.2* 20.0* 18.8*  NEUTROABS  --   --   --  17.3*  HGB 8.2* 8.5* 9.8* 7.6*  HCT 25.6* 26.0* 30.1* 23.3*  MCV 87.4 87.2 88.3 87.6  PLT 208 258 261 269    Cardiac Enzymes: No results for  input(s): CKTOTAL, CKMB, CKMBINDEX, TROPONINI in the last 168 hours.  BNP: Invalid input(s): POCBNP  CBG: No results for input(s): GLUCAP in the last 168 hours.  Microbiology: Results for orders placed or performed during the hospital encounter of 09/30/16  Culture, blood (routine x 2)     Status: None   Collection Time: 10/06/16  4:19 PM  Result Value Ref Range Status   Specimen Description BLOOD HEMODIALYSIS CATHETER  Final   Special Requests BOTTLES DRAWN AEROBIC ONLY 5CC  Final   Culture NO GROWTH 5 DAYS  Final   Report Status 10/11/2016 FINAL  Final  Culture, blood (routine x 2)     Status: None   Collection Time: 10/06/16  4:20 PM  Result Value Ref Range Status   Specimen Description BLOOD HEMODIALYSIS CATHETER  Final   Special Requests BOTTLES DRAWN AEROBIC ONLY 10CC  Final   Culture NO GROWTH 5 DAYS  Final   Report Status 10/11/2016 FINAL  Final    Coagulation Studies:  Recent Labs  10/23/16 0633  LABPROT 19.7*  INR 1.65    Urinalysis: No results for input(s): COLORURINE, LABSPEC, PHURINE, GLUCOSEU, HGBUR, BILIRUBINUR, KETONESUR, PROTEINUR, UROBILINOGEN, NITRITE, LEUKOCYTESUR in the last 72 hours.  Invalid input(s): APPERANCEUR    Imaging: Ct Abdomen Pelvis W Contrast  Result Date: 10/23/2016 CLINICAL DATA:  Pt has a PMHx of dementia, chronic kidney disease stage IV, congestive heart failure, scan today is for an abdominal abscess. He has been okayed to have IV contrast per Dr Cherylann Ratel (nephrology) Isovue 300 80 ml EXAM: CT ABDOMEN AND PELVIS WITH CONTRAST TECHNIQUE: Multidetector CT imaging of the abdomen and pelvis was performed using the standard protocol following bolus administration of intravenous contrast. CONTRAST:  80 cc Isovue 300 COMPARISON:  10/11/2016 plain films FINDINGS: Lower chest: There are moderate bilateral pleural effusions. Bibasilar atelectasis is present. There are coronary artery calcifications. The heart is mildly enlarged. Hepatobiliary:  Cholecystostomy tube 2 is in place. No discrete liver lesions. There is mild periportal edema. Pancreas: Unremarkable. No pancreatic ductal dilatation or surrounding inflammatory changes. Spleen: Normal in size without focal abnormality. Adrenals/Urinary Tract: The adrenal glands are normal in appearance. The kidneys are diminutive, measuring 8.4 cm on the right and 8.9 cm on the left. There are calcifications in the kidneys bilaterally which may represent vascular or urinary tract calcifications. No hydronephrosis. A midpole left renal cyst measures 1 cm. Urinary bladder is decompressed. Stomach/Bowel: A nasogastric tube is present in the stomach. Status post diverting colostomy to the left mid abdomen. No evidence for bowel obstruction or bowel wall thickening. Ostomy site is unremarkable. Vascular/Lymphatic: There is dense atherosclerotic calcification of the abdominal aorta and its branches. No retroperitoneal or mesenteric adenopathy. Reproductive: Prostate is unremarkable. Other: There is moderate free pelvic fluid. Diffuse body wall edema. Musculoskeletal: Scoliosis and associated spondylosis of the thoracolumbar spine. Other: Study quality is degraded by artifact from the patient's arms at the side. IMPRESSION: 1. Marked third-spacing; bilateral pleural effusions, ascites, and significant body wall edema. 2. Bilateral lower lobe atelectasis. 3. Coronary artery disease. 4. No evidence for intra-abdominal abscess. 5. Cholecystostomy tube. 6. Diminutive kidneys. Bilateral renal calcifications possibly representing vascular calcifications or calculi. 7. Left renal cyst. 8. Status post diverting colostomy to left mid abdomen. Electronically Signed   By: Norva Pavlov M.D.   On: 10/23/2016 18:56   US Abdomen Limited  Result Date: 10/23/2016 CLINICAL DATA:  Acute onset of abnormal LFTs.  Initial encounter. EXAM: US ABDOMEN LIMITED - RIGHT UPPER QUADRANT COMPARISON:  CT of the abdomen and pelvis performed  earlier today at 5:37 p.m. FINDINGS: Gallbladder: Contracted about the patient's cholecystostomy tube, and difficult to fully characterize. No ultrasonographic Murphy's sign elicited. Common bile duct: Diameter: 0.5 cm, within normal limits in caliber. Liver: No focal lesion identified. Within normal limits in parenchymal echogenicity. Left hepatic lobe not well characterized due to overlying bowel gas. A small right pleural effusion is again noted. IMPRESSION: 1. No acute abnormality seen within the liver, though the left hepatic lobe is not well characterized. 2. Gallbladder contracted about the patient's cholecystostomy tube and difficult to fully characterize, though grossly unremarkable in appearance. 3. Small right pleural effusion again noted. Electronically Signed   By: Roanna Raider M.D.   On: 10/23/2016 22:31     Medications:       Assessment/ Plan:  83 y.o.  African American male with a PMHx of dementia, chronic kidney disease stage IV, congestive heart failure ejection fraction 25-30%, who was admitted to [facility] on 09/30/2016 for ongoing management of cholecystitis and volvulus status post exploratory laparotomy with colectomy. Patient was previously in [place].  1.  Acute renal failure. 2.  CKD stage IV. 3.  Acute respiratory  failure. 4.  Metabolic acidosis. 5.  Anemia of CKD.     Plan:  Patient completed hemodialysis today. Ultrafiltration and she was 1.5 kg. We will plan for dialysis again on Friday.  Will start aranesp. He remains on the ventilator at this point in time and overall remains critically ill. Family continues to desire aggressive care however overall prognosis remains quite guarded.   LOS: 0 Domenica Weightman 2/14/20183:29 PM

## 2016-10-27 ENCOUNTER — Other Ambulatory Visit (HOSPITAL_COMMUNITY): Payer: Medicare Other

## 2016-10-27 LAB — RENAL FUNCTION PANEL
ALBUMIN: 1.5 g/dL — AB (ref 3.5–5.0)
ANION GAP: 11 (ref 5–15)
BUN: 53 mg/dL — ABNORMAL HIGH (ref 6–20)
CALCIUM: 7.5 mg/dL — AB (ref 8.9–10.3)
CO2: 23 mmol/L (ref 22–32)
CREATININE: 3.18 mg/dL — AB (ref 0.61–1.24)
Chloride: 95 mmol/L — ABNORMAL LOW (ref 101–111)
GFR calc non Af Amer: 17 mL/min — ABNORMAL LOW (ref >60)
GFR, EST AFRICAN AMERICAN: 19 mL/min — AB (ref >60)
Glucose, Bld: 162 mg/dL — ABNORMAL HIGH (ref 65–99)
PHOSPHORUS: 1.8 mg/dL — AB (ref 2.5–4.6)
Potassium: 3.9 mmol/L (ref 3.5–5.1)
SODIUM: 129 mmol/L — AB (ref 135–145)

## 2016-10-27 LAB — CBC
HCT: 22.5 % — ABNORMAL LOW (ref 39.0–52.0)
HEMOGLOBIN: 7.2 g/dL — AB (ref 13.0–17.0)
MCH: 28.6 pg (ref 26.0–34.0)
MCHC: 32 g/dL (ref 30.0–36.0)
MCV: 89.3 fL (ref 78.0–100.0)
PLATELETS: 274 10*3/uL (ref 150–400)
RBC: 2.52 MIL/uL — AB (ref 4.22–5.81)
RDW: 16.5 % — ABNORMAL HIGH (ref 11.5–15.5)
WBC: 18.9 10*3/uL — AB (ref 4.0–10.5)

## 2016-10-27 LAB — VANCOMYCIN, TROUGH: VANCOMYCIN TR: 8 ug/mL — AB (ref 15–20)

## 2016-10-27 NOTE — Progress Notes (Signed)
[facility]  ROUNDING NOTE   Subjective:  Patient off of Levothroid the same. However he remains critically ill. He is due for dialysis later today.   Objective:  Vital signs in last 24 hours:  Temperature 97.1 pulse 74 respirations 21 pressure 138/77  Physical Exam: General: Critically ill-appearing  Head: ETT in place, NG in place, nasal skin erosions noted  Eyes: Anicteric  Neck: Supple, trachea midline  Lungs:  Scattered rhonchi, on vent  Heart: S1S2 no rubs  Abdomen:  Soft, nontender, bowel sounds present, colostomy in place  Extremities: ++ dependent edema  Neurologic: not following commands  Skin: No lesions  Access: R IJ temp HD catheter    Basic Metabolic Panel:  Recent Labs Lab 10/22/16 0830 10/23/16 0633 10/25/16 0539 10/25/16 0920  NA 125* 123* 125* 127*  K 4.1 4.7 6.6* 4.2  CL 91* 90* 94* 93*  CO2 23 23 20* 24  GLUCOSE 229* 179* 180* 190*  BUN 49* 61* 60* 57*  CREATININE 3.06* 3.47* 3.35* 3.21*  CALCIUM 7.2* 7.2* 7.1* 7.3*  PHOS  --  1.4* 2.6 2.1*    Liver Function Tests:  Recent Labs Lab 10/22/16 0830 10/23/16 0633 10/23/16 1400 10/25/16 0539 10/25/16 0920  AST 211* 240* 193*  --   --   ALT 113* 135* 113*  --   --   ALKPHOS 157* 174* 139*  --   --   BILITOT 0.4 0.8 0.6  --   --   PROT 6.4* 7.3 5.7*  --   --   ALBUMIN 1.5* 1.4* 1.6* 1.3* 1.4*   No results for input(s): LIPASE, AMYLASE in the last 168 hours. No results for input(s): AMMONIA in the last 168 hours.  CBC:  Recent Labs Lab 10/22/16 0830 10/23/16 0633 10/25/16 0820  WBC 19.2* 20.0* 18.8*  NEUTROABS  --   --  17.3*  HGB 8.5* 9.8* 7.6*  HCT 26.0* 30.1* 23.3*  MCV 87.2 88.3 87.6  PLT 258 261 269    Cardiac Enzymes: No results for input(s): CKTOTAL, CKMB, CKMBINDEX, TROPONINI in the last 168 hours.  BNP: Invalid input(s): POCBNP  CBG: No results for input(s): GLUCAP in the last 168 hours.  Microbiology: Results for orders placed or performed  during the hospital encounter of 09/30/16  Culture, blood (routine x 2)     Status: None   Collection Time: 10/06/16  4:19 PM  Result Value Ref Range Status   Specimen Description BLOOD HEMODIALYSIS CATHETER  Final   Special Requests BOTTLES DRAWN AEROBIC ONLY 5CC  Final   Culture NO GROWTH 5 DAYS  Final   Report Status 10/11/2016 FINAL  Final  Culture, blood (routine x 2)     Status: None   Collection Time: 10/06/16  4:20 PM  Result Value Ref Range Status   Specimen Description BLOOD HEMODIALYSIS CATHETER  Final   Special Requests BOTTLES DRAWN AEROBIC ONLY 10CC  Final   Culture NO GROWTH 5 DAYS  Final   Report Status 10/11/2016 FINAL  Final    Coagulation Studies: No results for input(s): LABPROT, INR in the last 72 hours.  Urinalysis: No results for input(s): COLORURINE, LABSPEC, PHURINE, GLUCOSEU, HGBUR, BILIRUBINUR, KETONESUR, PROTEINUR, UROBILINOGEN, NITRITE, LEUKOCYTESUR in the last 72 hours.  Invalid input(s): APPERANCEUR    Imaging: No results found.   Medications:       Assessment/ Plan:  83 y.o.  African American male with a PMHx of dementia, chronic kidney disease stage IV, congestive heart failure ejection fraction  25-30%, who was admitted to [facility] on 09/30/2016 for ongoing management of cholecystitis and volvulus status post exploratory laparotomy with colectomy. Patient was previously in [place].  1.  Acute renal failure. 2.  CKD stage IV. 3.  Acute respiratory failure. 4.  Metabolic acidosis. 5.  Anemia of CKD.     Plan:  Patient due for hemodialysis again today. We will continue to use albumin with dialysis to help support blood pressure. Tracheostomy is currently being considered for his underlying respiratory failure. Overall however patient has multiorgan failure continues to have a very guarded prognosis. Continue supportive care otherwise.   LOS: 0 Kryslyn Helbig 2/16/20187:53 AM

## 2016-10-28 LAB — HEPATIC FUNCTION PANEL
ALBUMIN: 1.7 g/dL — AB (ref 3.5–5.0)
ALT: 102 U/L — ABNORMAL HIGH (ref 17–63)
AST: 135 U/L — AB (ref 15–41)
Alkaline Phosphatase: 192 U/L — ABNORMAL HIGH (ref 38–126)
BILIRUBIN TOTAL: 0.5 mg/dL (ref 0.3–1.2)
Bilirubin, Direct: 0.1 mg/dL (ref 0.1–0.5)
Indirect Bilirubin: 0.4 mg/dL (ref 0.3–0.9)
TOTAL PROTEIN: 7.6 g/dL (ref 6.5–8.1)

## 2016-10-30 LAB — CULTURE, RESPIRATORY

## 2016-10-30 LAB — CULTURE, RESPIRATORY W GRAM STAIN

## 2016-10-30 LAB — CBC
HCT: 24.1 % — ABNORMAL LOW (ref 39.0–52.0)
Hemoglobin: 7.5 g/dL — ABNORMAL LOW (ref 13.0–17.0)
MCH: 28.4 pg (ref 26.0–34.0)
MCHC: 31.1 g/dL (ref 30.0–36.0)
MCV: 91.3 fL (ref 78.0–100.0)
PLATELETS: 274 10*3/uL (ref 150–400)
RBC: 2.64 MIL/uL — AB (ref 4.22–5.81)
RDW: 17 % — ABNORMAL HIGH (ref 11.5–15.5)
WBC: 20.1 10*3/uL — ABNORMAL HIGH (ref 4.0–10.5)

## 2016-10-30 LAB — RENAL FUNCTION PANEL
Albumin: 1.5 g/dL — ABNORMAL LOW (ref 3.5–5.0)
Anion gap: 13 (ref 5–15)
BUN: 67 mg/dL — ABNORMAL HIGH (ref 6–20)
CALCIUM: 7.6 mg/dL — AB (ref 8.9–10.3)
CO2: 23 mmol/L (ref 22–32)
CREATININE: 3.47 mg/dL — AB (ref 0.61–1.24)
Chloride: 93 mmol/L — ABNORMAL LOW (ref 101–111)
GFR calc Af Amer: 17 mL/min — ABNORMAL LOW (ref >60)
GFR calc non Af Amer: 15 mL/min — ABNORMAL LOW (ref >60)
Glucose, Bld: 141 mg/dL — ABNORMAL HIGH (ref 65–99)
Phosphorus: 2.7 mg/dL (ref 2.5–4.6)
Potassium: 4.5 mmol/L (ref 3.5–5.1)
SODIUM: 129 mmol/L — AB (ref 135–145)

## 2016-10-30 NOTE — Progress Notes (Signed)
[facility]  ROUNDING NOTE   Subjective:  Patient remains critically ill. Seen during HD Becomes agitated at times and drops sats. Calms after suctioning   Objective:  Vital signs in last 24 hours:  Temperature 96.8  pulse 74 respirations 15 pressure 138/73  Physical Exam: General: Critically ill-appearing, temporal easting noted  Head: ETT in place, NG in place, nasal skin erosions noted  Eyes: Anicteric  Neck: Supple, trachea midline  Lungs:  Scattered rhonchi, on vent, Crackles b/l   Heart: S1S2 no rubs  Abdomen:  Soft, nontender, bowel sounds present, colostomy in place  Extremities: ++ dependent edema  Neurologic: not following commands  Skin: Warm & dry  Access: R IJ temp HD catheter    Basic Metabolic Panel:  Recent Labs Lab 10/25/16 0539 10/25/16 0920 10/27/16 0841 10/30/16 0500  NA 125* 127* 129* 129*  K 6.6* 4.2 3.9 4.5  CL 94* 93* 95* 93*  CO2 20* 24 23 23   GLUCOSE 180* 190* 162* 141*  BUN 60* 57* 53* 67*  CREATININE 3.35* 3.21* 3.18* 3.47*  CALCIUM 7.1* 7.3* 7.5* 7.6*  PHOS 2.6 2.1* 1.8* 2.7    Liver Function Tests:  Recent Labs Lab 10/25/16 0539 10/25/16 0920 10/27/16 0841 10/28/16 0717 10/30/16 0500  AST  --   --   --  135*  --   ALT  --   --   --  102*  --   ALKPHOS  --   --   --  192*  --   BILITOT  --   --   --  0.5  --   PROT  --   --   --  7.6  --   ALBUMIN 1.3* 1.4* 1.5* 1.7* 1.5*   No results for input(s): LIPASE, AMYLASE in the last 168 hours. No results for input(s): AMMONIA in the last 168 hours.  CBC:  Recent Labs Lab 10/25/16 0820 10/27/16 0841 10/30/16 0500  WBC 18.8* 18.9* 20.1*  NEUTROABS 17.3*  --   --   HGB 7.6* 7.2* 7.5*  HCT 23.3* 22.5* 24.1*  MCV 87.6 89.3 91.3  PLT 269 274 274    Cardiac Enzymes: No results for input(s): CKTOTAL, CKMB, CKMBINDEX, TROPONINI in the last 168 hours.  BNP: Invalid input(s): POCBNP  CBG: No results for input(s): GLUCAP in the last 168  hours.  Microbiology: Results for orders placed or performed during the hospital encounter of 09/30/16  Culture, blood (routine x 2)     Status: None   Collection Time: 10/06/16  4:19 PM  Result Value Ref Range Status   Specimen Description BLOOD HEMODIALYSIS CATHETER  Final   Special Requests BOTTLES DRAWN AEROBIC ONLY 5CC  Final   Culture NO GROWTH 5 DAYS  Final   Report Status 10/11/2016 FINAL  Final  Culture, blood (routine x 2)     Status: None   Collection Time: 10/06/16  4:20 PM  Result Value Ref Range Status   Specimen Description BLOOD HEMODIALYSIS CATHETER  Final   Special Requests BOTTLES DRAWN AEROBIC ONLY 10CC  Final   Culture NO GROWTH 5 DAYS  Final   Report Status 10/11/2016 FINAL  Final  Culture, respiratory (NON-Expectorated)     Status: None   Collection Time: 10/27/16  2:03 PM  Result Value Ref Range Status   Specimen Description TRACHEAL ASPIRATE  Final   Special Requests NONE  Final   Gram Stain   Final    ABUNDANT WBC PRESENT, PREDOMINANTLY MONONUCLEAR ABUNDANT GRAM POSITIVE  COCCI IN PAIRS ABUNDANT GRAM NEGATIVE COCCI IN PAIRS    Culture   Final    MODERATE METHICILLIN RESISTANT STAPHYLOCOCCUS AUREUS   Report Status 10/30/2016 FINAL  Final   Organism ID, Bacteria METHICILLIN RESISTANT STAPHYLOCOCCUS AUREUS  Final      Susceptibility   Methicillin resistant staphylococcus aureus - MIC*    CIPROFLOXACIN >=8 RESISTANT Resistant     ERYTHROMYCIN >=8 RESISTANT Resistant     GENTAMICIN <=0.5 SENSITIVE Sensitive     OXACILLIN >=4 RESISTANT Resistant     TETRACYCLINE <=1 SENSITIVE Sensitive     VANCOMYCIN 1 SENSITIVE Sensitive     TRIMETH/SULFA <=10 SENSITIVE Sensitive     CLINDAMYCIN <=0.25 SENSITIVE Sensitive     RIFAMPIN <=0.5 SENSITIVE Sensitive     Inducible Clindamycin NEGATIVE Sensitive     * MODERATE METHICILLIN RESISTANT STAPHYLOCOCCUS AUREUS    Coagulation Studies: No results for input(s): LABPROT, INR in the last 72 hours.  Urinalysis: No  results for input(s): COLORURINE, LABSPEC, PHURINE, GLUCOSEU, HGBUR, BILIRUBINUR, KETONESUR, PROTEINUR, UROBILINOGEN, NITRITE, LEUKOCYTESUR in the last 72 hours.  Invalid input(s): APPERANCEUR    Imaging: No results found.   Medications:       Assessment/ Plan:  83 y.o.  African American male with a PMHx of dementia, chronic kidney disease stage IV, congestive heart failure ejection fraction 25-30%, who was admitted to [facility] on 09/30/2016 for ongoing management of cholecystitis and volvulus status post exploratory laparotomy with colectomy. Patient was previously in [place].  1.  Acute renal failure. 2.  CKD stage IV. 3.  Acute respiratory failure., vent dependent at present 4.  Metabolic acidosis. 5.  Anemia of CKD.  6.  Anasarca   Plan:  Patient seen during hemodialysis. UF goal increased to 3 L. We will continue to use albumin with dialysis to help support blood pressure. Tracheostomy is currently being considered for his underlying respiratory failure. Overall however patient has multiorgan failure continues to have a very guarded prognosis. Continue supportive care otherwise. Extra UF session tomorrow and dialysis again on Wednesday   LOS: 0 Kao Berkheimer 2/19/20184:10 PM

## 2016-11-01 LAB — VANCOMYCIN, RANDOM: VANCOMYCIN RM: 8

## 2016-11-01 LAB — RENAL FUNCTION PANEL
ALBUMIN: 1.8 g/dL — AB (ref 3.5–5.0)
ANION GAP: 8 (ref 5–15)
BUN: 34 mg/dL — AB (ref 6–20)
CO2: 29 mmol/L (ref 22–32)
Calcium: 7.7 mg/dL — ABNORMAL LOW (ref 8.9–10.3)
Chloride: 95 mmol/L — ABNORMAL LOW (ref 101–111)
Creatinine, Ser: 2.04 mg/dL — ABNORMAL HIGH (ref 0.61–1.24)
GFR, EST AFRICAN AMERICAN: 33 mL/min — AB (ref >60)
GFR, EST NON AFRICAN AMERICAN: 29 mL/min — AB (ref >60)
Glucose, Bld: 121 mg/dL — ABNORMAL HIGH (ref 65–99)
PHOSPHORUS: 1.5 mg/dL — AB (ref 2.5–4.6)
Potassium: 3.6 mmol/L (ref 3.5–5.1)
Sodium: 132 mmol/L — ABNORMAL LOW (ref 135–145)

## 2016-11-01 LAB — CBC
HEMATOCRIT: 21.9 % — AB (ref 39.0–52.0)
HEMOGLOBIN: 6.8 g/dL — AB (ref 13.0–17.0)
MCH: 28.7 pg (ref 26.0–34.0)
MCHC: 31.1 g/dL (ref 30.0–36.0)
MCV: 92.4 fL (ref 78.0–100.0)
Platelets: 181 10*3/uL (ref 150–400)
RBC: 2.37 MIL/uL — ABNORMAL LOW (ref 4.22–5.81)
RDW: 18.6 % — ABNORMAL HIGH (ref 11.5–15.5)
WBC: 18.1 10*3/uL — AB (ref 4.0–10.5)

## 2016-11-01 NOTE — Progress Notes (Signed)
[facility]  ROUNDING NOTE   Subjective:  Patient completed hemodialysis today. Ultrafiltration she was 3 kg. Wife at bedside.   Objective:  Vital signs in last 24 hours:  emperature 97.7 pulse 81 respirations 16 blood pressure 102/58  Physical Exam: General: Critically ill-appearing, temporal wasting noted  Head: ETT in place, NG in place, nasal skin erosions noted  Eyes: Anicteric  Neck: Supple, trachea midline  Lungs:  Scattered rhonchi, on vent  Heart: S1S2 no rubs  Abdomen:  Soft, nontender, bowel sounds present, colostomy in place  Extremities: ++ dependent edema  Neurologic: Arousable, not following commands  Skin: Warm & dry  Access: R IJ temp HD catheter    Basic Metabolic Panel:  Recent Labs Lab 10/27/16 0841 10/30/16 0500  NA 129* 129*  K 3.9 4.5  CL 95* 93*  CO2 23 23  GLUCOSE 162* 141*  BUN 53* 67*  CREATININE 3.18* 3.47*  CALCIUM 7.5* 7.6*  PHOS 1.8* 2.7    Liver Function Tests:  Recent Labs Lab 10/27/16 0841 10/28/16 0717 10/30/16 0500  AST  --  135*  --   ALT  --  102*  --   ALKPHOS  --  192*  --   BILITOT  --  0.5  --   PROT  --  7.6  --   ALBUMIN 1.5* 1.7* 1.5*   No results for input(s): LIPASE, AMYLASE in the last 168 hours. No results for input(s): AMMONIA in the last 168 hours.  CBC:  Recent Labs Lab 10/27/16 0841 10/30/16 0500  WBC 18.9* 20.1*  HGB 7.2* 7.5*  HCT 22.5* 24.1*  MCV 89.3 91.3  PLT 274 274    Cardiac Enzymes: No results for input(s): CKTOTAL, CKMB, CKMBINDEX, TROPONINI in the last 168 hours.  BNP: Invalid input(s): POCBNP  CBG: No results for input(s): GLUCAP in the last 168 hours.  Microbiology: Results for orders placed or performed during the hospital encounter of 09/30/16  Culture, blood (routine x 2)     Status: None   Collection Time: 10/06/16  4:19 PM  Result Value Ref Range Status   Specimen Description BLOOD HEMODIALYSIS CATHETER  Final   Special Requests BOTTLES DRAWN  AEROBIC ONLY 5CC  Final   Culture NO GROWTH 5 DAYS  Final   Report Status 10/11/2016 FINAL  Final  Culture, blood (routine x 2)     Status: None   Collection Time: 10/06/16  4:20 PM  Result Value Ref Range Status   Specimen Description BLOOD HEMODIALYSIS CATHETER  Final   Special Requests BOTTLES DRAWN AEROBIC ONLY 10CC  Final   Culture NO GROWTH 5 DAYS  Final   Report Status 10/11/2016 FINAL  Final  Culture, respiratory (NON-Expectorated)     Status: None   Collection Time: 10/27/16  2:03 PM  Result Value Ref Range Status   Specimen Description TRACHEAL ASPIRATE  Final   Special Requests NONE  Final   Gram Stain   Final    ABUNDANT WBC PRESENT, PREDOMINANTLY MONONUCLEAR ABUNDANT GRAM POSITIVE COCCI IN PAIRS ABUNDANT GRAM NEGATIVE COCCI IN PAIRS    Culture   Final    MODERATE METHICILLIN RESISTANT STAPHYLOCOCCUS AUREUS   Report Status 10/30/2016 FINAL  Final   Organism ID, Bacteria METHICILLIN RESISTANT STAPHYLOCOCCUS AUREUS  Final      Susceptibility   Methicillin resistant staphylococcus aureus - MIC*    CIPROFLOXACIN >=8 RESISTANT Resistant     ERYTHROMYCIN >=8 RESISTANT Resistant     GENTAMICIN <=0.5 SENSITIVE Sensitive  OXACILLIN >=4 RESISTANT Resistant     TETRACYCLINE <=1 SENSITIVE Sensitive     VANCOMYCIN 1 SENSITIVE Sensitive     TRIMETH/SULFA <=10 SENSITIVE Sensitive     CLINDAMYCIN <=0.25 SENSITIVE Sensitive     RIFAMPIN <=0.5 SENSITIVE Sensitive     Inducible Clindamycin NEGATIVE Sensitive     * MODERATE METHICILLIN RESISTANT STAPHYLOCOCCUS AUREUS    Coagulation Studies: No results for input(s): LABPROT, INR in the last 72 hours.  Urinalysis: No results for input(s): COLORURINE, LABSPEC, PHURINE, GLUCOSEU, HGBUR, BILIRUBINUR, KETONESUR, PROTEINUR, UROBILINOGEN, NITRITE, LEUKOCYTESUR in the last 72 hours.  Invalid input(s): APPERANCEUR    Imaging: No results found.   Medications:       Assessment/ Plan:  83 y.o.  African American male with a  PMHx of dementia, chronic kidney disease stage IV, congestive heart failure ejection fraction 25-30%, who was admitted to [facility] on 09/30/2016 for ongoing management of cholecystitis and volvulus status post exploratory laparotomy with colectomy. Patient was previously in [place].  1.  Acute renal failure. 2.  CKD stage IV. 3.  Acute respiratory failure., vent dependent at present 4.  Metabolic acidosis. 5.  Anemia of CKD.  6.  Anasarca   Plan:  Patient completed hemodialysis today. Ultrafiltration achieved was 3 kg. Unclear if the patient will regain enough kidney function to come off of hemodialysis. Tentatively we will plan for dialysis again on Friday.  Continue Aranesp 30 mcg weekly.Overall however patient continues to have a guarded prognosis.  LOS: 0 Jerzie Bieri 2/21/20183:52 PM

## 2016-11-02 LAB — HEPATITIS B SURFACE ANTIGEN: HEP B S AG: NEGATIVE

## 2016-11-02 LAB — TYPE AND SCREEN
ABO/RH(D): A POS
Antibody Screen: NEGATIVE

## 2016-11-03 LAB — CBC
HCT: 23.4 % — ABNORMAL LOW (ref 39.0–52.0)
Hemoglobin: 7.3 g/dL — ABNORMAL LOW (ref 13.0–17.0)
MCH: 29.1 pg (ref 26.0–34.0)
MCHC: 31.2 g/dL (ref 30.0–36.0)
MCV: 93.2 fL (ref 78.0–100.0)
PLATELETS: 182 10*3/uL (ref 150–400)
RBC: 2.51 MIL/uL — AB (ref 4.22–5.81)
RDW: 19.7 % — AB (ref 11.5–15.5)
WBC: 18.1 10*3/uL — AB (ref 4.0–10.5)

## 2016-11-03 LAB — RENAL FUNCTION PANEL
ALBUMIN: 1.6 g/dL — AB (ref 3.5–5.0)
Anion gap: 9 (ref 5–15)
BUN: 53 mg/dL — AB (ref 6–20)
CALCIUM: 7.8 mg/dL — AB (ref 8.9–10.3)
CO2: 27 mmol/L (ref 22–32)
CREATININE: 2.91 mg/dL — AB (ref 0.61–1.24)
Chloride: 94 mmol/L — ABNORMAL LOW (ref 101–111)
GFR calc Af Amer: 22 mL/min — ABNORMAL LOW (ref >60)
GFR, EST NON AFRICAN AMERICAN: 19 mL/min — AB (ref >60)
Glucose, Bld: 129 mg/dL — ABNORMAL HIGH (ref 65–99)
PHOSPHORUS: 2.2 mg/dL — AB (ref 2.5–4.6)
Potassium: 4 mmol/L (ref 3.5–5.1)
SODIUM: 130 mmol/L — AB (ref 135–145)

## 2016-11-03 LAB — VANCOMYCIN, RANDOM: VANCOMYCIN RM: 15

## 2016-11-03 NOTE — Progress Notes (Signed)
[facility]  ROUNDING NOTE   Subjective:  Patient continues to tolerate dialysis well. He also has significant anemia and hemoglobin currently up to 7.3. Overall he remains critically ill.   Objective:  Vital signs in last 24 hours:  temperature 98.9 pulse 81 respirations 10 blood pressure 155/86  Physical Exam: General: Critically ill-appearing  Head: ETT in place, NG in place, nasal skin erosions noted  Eyes: Anicteric  Neck: Supple, trachea midline  Lungs:  Scattered rhonchi, on vent  Heart: S1S2 no rubs  Abdomen:  Soft, nontender, bowel sounds present, colostomy in place  Extremities: 1+ dependent edema  Neurologic: Arousable, not following commands  Skin: Warm & dry  Access: R IJ temp HD catheter    Basic Metabolic Panel:  Recent Labs Lab 10/30/16 0500 11/01/16 1624 11/03/16 0610  NA 129* 132* 130*  K 4.5 3.6 4.0  CL 93* 95* 94*  CO2 23 29 27   GLUCOSE 141* 121* 129*  BUN 67* 34* 53*  CREATININE 3.47* 2.04* 2.91*  CALCIUM 7.6* 7.7* 7.8*  PHOS 2.7 1.5* 2.2*    Liver Function Tests:  Recent Labs Lab 10/28/16 0717 10/30/16 0500 11/01/16 1624 11/03/16 0610  AST 135*  --   --   --   ALT 102*  --   --   --   ALKPHOS 192*  --   --   --   BILITOT 0.5  --   --   --   PROT 7.6  --   --   --   ALBUMIN 1.7* 1.5* 1.8* 1.6*   No results for input(s): LIPASE, AMYLASE in the last 168 hours. No results for input(s): AMMONIA in the last 168 hours.  CBC:  Recent Labs Lab 10/30/16 0500 11/01/16 1650 11/03/16 0610  WBC 20.1* 18.1* 18.1*  HGB 7.5* 6.8* 7.3*  HCT 24.1* 21.9* 23.4*  MCV 91.3 92.4 93.2  PLT 274 181 182    Cardiac Enzymes: No results for input(s): CKTOTAL, CKMB, CKMBINDEX, TROPONINI in the last 168 hours.  BNP: Invalid input(s): POCBNP  CBG: No results for input(s): GLUCAP in the last 168 hours.  Microbiology: Results for orders placed or performed during the hospital encounter of 09/30/16  Culture, blood (routine x 2)      Status: None   Collection Time: 10/06/16  4:19 PM  Result Value Ref Range Status   Specimen Description BLOOD HEMODIALYSIS CATHETER  Final   Special Requests BOTTLES DRAWN AEROBIC ONLY 5CC  Final   Culture NO GROWTH 5 DAYS  Final   Report Status 10/11/2016 FINAL  Final  Culture, blood (routine x 2)     Status: None   Collection Time: 10/06/16  4:20 PM  Result Value Ref Range Status   Specimen Description BLOOD HEMODIALYSIS CATHETER  Final   Special Requests BOTTLES DRAWN AEROBIC ONLY 10CC  Final   Culture NO GROWTH 5 DAYS  Final   Report Status 10/11/2016 FINAL  Final  Culture, respiratory (NON-Expectorated)     Status: None   Collection Time: 10/27/16  2:03 PM  Result Value Ref Range Status   Specimen Description TRACHEAL ASPIRATE  Final   Special Requests NONE  Final   Gram Stain   Final    ABUNDANT WBC PRESENT, PREDOMINANTLY MONONUCLEAR ABUNDANT GRAM POSITIVE COCCI IN PAIRS ABUNDANT GRAM NEGATIVE COCCI IN PAIRS    Culture   Final    MODERATE METHICILLIN RESISTANT STAPHYLOCOCCUS AUREUS   Report Status 10/30/2016 FINAL  Final   Organism ID, Bacteria METHICILLIN  RESISTANT STAPHYLOCOCCUS AUREUS  Final      Susceptibility   Methicillin resistant staphylococcus aureus - MIC*    CIPROFLOXACIN >=8 RESISTANT Resistant     ERYTHROMYCIN >=8 RESISTANT Resistant     GENTAMICIN <=0.5 SENSITIVE Sensitive     OXACILLIN >=4 RESISTANT Resistant     TETRACYCLINE <=1 SENSITIVE Sensitive     VANCOMYCIN 1 SENSITIVE Sensitive     TRIMETH/SULFA <=10 SENSITIVE Sensitive     CLINDAMYCIN <=0.25 SENSITIVE Sensitive     RIFAMPIN <=0.5 SENSITIVE Sensitive     Inducible Clindamycin NEGATIVE Sensitive     * MODERATE METHICILLIN RESISTANT STAPHYLOCOCCUS AUREUS    Coagulation Studies: No results for input(s): LABPROT, INR in the last 72 hours.  Urinalysis: No results for input(s): COLORURINE, LABSPEC, PHURINE, GLUCOSEU, HGBUR, BILIRUBINUR, KETONESUR, PROTEINUR, UROBILINOGEN, NITRITE, LEUKOCYTESUR  in the last 72 hours.  Invalid input(s): APPERANCEUR    Imaging: No results found.   Medications:       Assessment/ Plan:  83 y.o.  African American male with a PMHx of dementia, chronic kidney disease stage IV, congestive heart failure ejection fraction 25-30%, who was admitted to [facility] on 09/30/2016 for ongoing management of cholecystitis and volvulus status post exploratory laparotomy with colectomy. Patient was previously in [place].  1.  Acute renal failure. 2.  CKD stage IV. 3.  Acute respiratory failure., vent dependent 4.  Metabolic acidosis. 5.  Anemia of CKD.  6.  Anasarca   Plan:   we will continue the patient on a Monday, Wednesday, Friday dialysis schedule. We will prepare her orders for Monday again. Ultrafiltration target on Monday will be 3 kg. Hemoglobin currently 7.3. Continue the patient on Aranesp per protocol.  Phosphorus remains under good control at 2.2. Overall patient continues to have a very guarded prognosis however.    LOS: 0 Lamiracle Chaidez 2/23/20185:44 PM

## 2016-11-05 LAB — BASIC METABOLIC PANEL
Anion gap: 12 (ref 5–15)
BUN: 48 mg/dL — AB (ref 6–20)
CALCIUM: 7.9 mg/dL — AB (ref 8.9–10.3)
CHLORIDE: 95 mmol/L — AB (ref 101–111)
CO2: 22 mmol/L (ref 22–32)
CREATININE: 2.86 mg/dL — AB (ref 0.61–1.24)
GFR, EST AFRICAN AMERICAN: 22 mL/min — AB (ref >60)
GFR, EST NON AFRICAN AMERICAN: 19 mL/min — AB (ref >60)
Glucose, Bld: 102 mg/dL — ABNORMAL HIGH (ref 65–99)
Potassium: 4.5 mmol/L (ref 3.5–5.1)
SODIUM: 129 mmol/L — AB (ref 135–145)

## 2016-11-05 LAB — CBC
HCT: 27.8 % — ABNORMAL LOW (ref 39.0–52.0)
HEMOGLOBIN: 8.6 g/dL — AB (ref 13.0–17.0)
MCH: 29.9 pg (ref 26.0–34.0)
MCHC: 30.9 g/dL (ref 30.0–36.0)
MCV: 96.5 fL (ref 78.0–100.0)
PLATELETS: 154 10*3/uL (ref 150–400)
RBC: 2.88 MIL/uL — ABNORMAL LOW (ref 4.22–5.81)
RDW: 20.8 % — AB (ref 11.5–15.5)
WBC: 22.3 10*3/uL — ABNORMAL HIGH (ref 4.0–10.5)

## 2016-11-05 NOTE — Anesthesia Preprocedure Evaluation (Addendum)
Anesthesia Evaluation  Patient identified by MRN, date of birth, ID band Patient awake    Reviewed: Unable to perform ROS - Chart review only  Airway Mallampati: II   Neck ROM: Full   Comment: Intubated Dental  (+) Edentulous Upper   Pulmonary  Resp failure c intubation   + rhonchi  + decreased breath sounds      Cardiovascular + Past MI and +CHF   Rhythm:Regular     Neuro/Psych dementia    GI/Hepatic   Endo/Other    Renal/GU Dialysis and ESRFRenal disease     Musculoskeletal   Abdominal   Peds  Hematology   Anesthesia Other Findings   Reproductive/Obstetrics                            Anesthesia Physical Anesthesia Plan  ASA: IV  Anesthesia Plan: General   Post-op Pain Management:    Induction: Inhalational  Airway Management Planned: Tracheostomy  Additional Equipment:   Intra-op Plan:   Post-operative Plan:   Informed Consent: I have reviewed the patients History and Physical, chart, labs and discussed the procedure including the risks, benefits and alternatives for the proposed anesthesia with the patient or authorized representative who has indicated his/her understanding and acceptance.     Plan Discussed with:   Anesthesia Plan Comments:         Anesthesia Quick Evaluation

## 2016-11-06 ENCOUNTER — Encounter
Admission: AD | Disposition: A | Discharge status: Hospice | Payer: Self-pay | Source: Other Acute Inpatient Hospital | Attending: Internal Medicine

## 2016-11-06 ENCOUNTER — Encounter (HOSPITAL_COMMUNITY): Payer: Medicare Other | Admitting: Anesthesiology

## 2016-11-06 ENCOUNTER — Encounter: Payer: Self-pay | Admitting: Certified Registered"

## 2016-11-06 HISTORY — PX: TRACHEOSTOMY TUBE PLACEMENT: SHX814

## 2016-11-06 LAB — CBC
HCT: 22.6 % — ABNORMAL LOW (ref 39.0–52.0)
HCT: 20 % — ABNORMAL LOW (ref 39.0–52.0)
Hemoglobin: 7 g/dL — ABNORMAL LOW (ref 13.0–17.0)
Hemoglobin: 6.3 g/dL — CL (ref 13.0–17.0)
MCH: 29.2 pg (ref 26.0–34.0)
MCH: 29.2 pg (ref 26.0–34.0)
MCHC: 31 g/dL (ref 30.0–36.0)
MCHC: 31.5 g/dL (ref 30.0–36.0)
MCV: 94.2 fL (ref 78.0–100.0)
MCV: 92.6 fL (ref 78.0–100.0)
PLATELETS: 154 10*3/uL (ref 150–400)
Platelets: 153 10*3/uL (ref 150–400)
RBC: 2.4 MIL/uL — ABNORMAL LOW (ref 4.22–5.81)
RBC: 2.16 MIL/uL — AB (ref 4.22–5.81)
RDW: 20 % — AB (ref 11.5–15.5)
RDW: 20 % — ABNORMAL HIGH (ref 11.5–15.5)
WBC: 22.7 10*3/uL — AB (ref 4.0–10.5)
WBC: 16.4 10*3/uL — AB (ref 4.0–10.5)

## 2016-11-06 SURGERY — CREATION, TRACHEOSTOMY
Anesthesia: General | Site: Neck

## 2016-11-06 MED ORDER — PHENYLEPHRINE HCL 10 MG/ML IJ SOLN
INTRAMUSCULAR | Status: DC | PRN
  Administered 2016-11-06 (×4): 80 ug via INTRAVENOUS

## 2016-11-06 MED ORDER — 0.9 % SODIUM CHLORIDE (POUR BTL) OPTIME
TOPICAL | Status: DC | PRN
  Administered 2016-11-06: 1000 mL

## 2016-11-06 MED ORDER — EPHEDRINE SULFATE 50 MG/ML IJ SOLN
INTRAMUSCULAR | Status: DC | PRN
  Administered 2016-11-06 (×2): 10 mg via INTRAVENOUS

## 2016-11-06 MED ORDER — ROCURONIUM BROMIDE 100 MG/10ML IV SOLN
INTRAVENOUS | Status: DC | PRN
  Administered 2016-11-06: 50 mg via INTRAVENOUS

## 2016-11-06 MED ORDER — LIDOCAINE-EPINEPHRINE 1 %-1:100000 IJ SOLN
INTRAMUSCULAR | Status: DC | PRN
  Administered 2016-11-06: 4 mL

## 2016-11-06 MED ORDER — FENTANYL CITRATE (PF) 100 MCG/2ML IJ SOLN
INTRAMUSCULAR | Status: DC | PRN
  Administered 2016-11-06: 50 ug via INTRAVENOUS

## 2016-11-06 MED ORDER — PROPOFOL 10 MG/ML IV BOLUS
INTRAVENOUS | Status: AC
Start: 2016-11-06 — End: 2016-11-06
  Filled 2016-11-06: qty 20

## 2016-11-06 MED ORDER — MIDAZOLAM HCL 5 MG/5ML IJ SOLN
INTRAMUSCULAR | Status: DC | PRN
  Administered 2016-11-06: 2 mg via INTRAVENOUS

## 2016-11-06 MED ORDER — LACTATED RINGERS IV SOLN
INTRAVENOUS | Status: DC | PRN
  Administered 2016-11-06: 08:00:00 via INTRAVENOUS

## 2016-11-06 MED ORDER — MIDAZOLAM HCL 2 MG/2ML IJ SOLN
INTRAMUSCULAR | Status: AC
  Filled 2016-11-06: qty 2

## 2016-11-06 MED ORDER — FENTANYL CITRATE (PF) 100 MCG/2ML IJ SOLN
INTRAMUSCULAR | Status: AC
  Filled 2016-11-06: qty 2

## 2016-11-06 SURGICAL SUPPLY — 39 items
ATTRACTOMAT 16X20 MAGNETIC DRP (DRAPES) IMPLANT
BLADE SURG 15 STRL LF DISP TIS (BLADE) ×1 IMPLANT
BLADE SURG 15 STRL SS (BLADE) ×2
CLEANER TIP ELECTROSURG 2X2 (MISCELLANEOUS) ×3 IMPLANT
COVER SURGICAL LIGHT HANDLE (MISCELLANEOUS) ×3 IMPLANT
DRAPE PROXIMA HALF (DRAPES) ×3 IMPLANT
ELECT COATED BLADE 2.86 ST (ELECTRODE) ×3 IMPLANT
ELECT REM PT RETURN 9FT ADLT (ELECTROSURGICAL) ×3
ELECTRODE REM PT RTRN 9FT ADLT (ELECTROSURGICAL) ×1 IMPLANT
GAUZE SPONGE 4X4 16PLY XRAY LF (GAUZE/BANDAGES/DRESSINGS) ×3 IMPLANT
GEL ULTRASOUND 20GR AQUASONIC (MISCELLANEOUS) ×3 IMPLANT
GLOVE SS BIOGEL STRL SZ 7.5 (GLOVE) ×1 IMPLANT
GLOVE SUPERSENSE BIOGEL SZ 7.5 (GLOVE) ×2
GOWN STRL REUS W/ TWL LRG LVL3 (GOWN DISPOSABLE) ×2 IMPLANT
GOWN STRL REUS W/ TWL XL LVL3 (GOWN DISPOSABLE) ×1 IMPLANT
GOWN STRL REUS W/TWL LRG LVL3 (GOWN DISPOSABLE) ×4
GOWN STRL REUS W/TWL XL LVL3 (GOWN DISPOSABLE) ×2
HOLDER TRACH TUBE VELCRO 19.5 (MISCELLANEOUS) IMPLANT
KIT BASIN OR (CUSTOM PROCEDURE TRAY) ×3 IMPLANT
KIT ROOM TURNOVER OR (KITS) ×3 IMPLANT
KIT SUCTION CATH 14FR (SUCTIONS) ×3 IMPLANT
NEEDLE HYPO 25GX1X1/2 BEV (NEEDLE) IMPLANT
NS IRRIG 1000ML POUR BTL (IV SOLUTION) ×3 IMPLANT
PACK EENT II TURBAN DRAPE (CUSTOM PROCEDURE TRAY) ×3 IMPLANT
PAD ARMBOARD 7.5X6 YLW CONV (MISCELLANEOUS) ×6 IMPLANT
PENCIL BUTTON HOLSTER BLD 10FT (ELECTRODE) ×3 IMPLANT
SPONGE DRAIN TRACH 4X4 STRL 2S (GAUZE/BANDAGES/DRESSINGS) ×3 IMPLANT
SPONGE INTESTINAL PEANUT (DISPOSABLE) ×3 IMPLANT
SUT SILK 2 0 SH CR/8 (SUTURE) ×3 IMPLANT
SUT SILK 3 0 TIES 10X30 (SUTURE) IMPLANT
SYR 5ML LUER SLIP (SYRINGE) ×3 IMPLANT
SYR CONTROL 10ML LL (SYRINGE) ×3 IMPLANT
TOWEL OR 17X24 6PK STRL BLUE (TOWEL DISPOSABLE) IMPLANT
TOWEL OR 17X26 10 PK STRL BLUE (TOWEL DISPOSABLE) ×3 IMPLANT
TUBE CONNECTING 12'X1/4 (SUCTIONS) ×1
TUBE CONNECTING 12X1/4 (SUCTIONS) ×2 IMPLANT
TUBE TRACH SHILEY  6 DIST  CUF (TUBING) IMPLANT
TUBE TRACH SHILEY 10 DIST CUFF (TUBING) IMPLANT
TUBE TRACH SHILEY 8 DIST CUF (TUBING) ×3 IMPLANT

## 2016-11-06 NOTE — Anesthesia Postprocedure Evaluation (Addendum)
Anesthesia Post Note  Patient: Jesus Vega  Procedure(s) Performed: Procedure(s) (LRB): TRACHEOSTOMY (N/A)  Patient location during evaluation: SICU Anesthesia Type: General Level of consciousness: sedated Pain management: pain level controlled Vital Signs Assessment: post-procedure vital signs reviewed and stable Respiratory status: patient remains intubated per anesthesia plan Cardiovascular status: stable Anesthetic complications: no       Last Vitals: There were no vitals filed for this visit.  Last Pain: There were no vitals filed for this visit.               Antron Seth,JAMES TERRILL

## 2016-11-06 NOTE — Brief Op Note (Signed)
09/30/2016 - 11/06/2016  9:01 AM  PATIENT:  Jesus Vega  83 y.o. male  PRE-OPERATIVE DIAGNOSIS:  ACUTE AND CHRONIC RESPRITORY FAILURE  POST-OPERATIVE DIAGNOSIS:  ACUTE AND CHRONIC RESPRITORY FAILURE  PROCEDURE:  Procedure(s): TRACHEOSTOMY (N/A)  #8 Shiley  SURGEON:  Surgeon(s) and Role:    * Drema Halonhristopher E Ivonna Kinnick, MD - Primary  PHYSICIAN ASSISTANT:   ASSISTANTS: none   ANESTHESIA:   general  EBL:  Total I/O In: 500 [I.V.:500] Out: 20 [Blood:20]  BLOOD ADMINISTERED:none  DRAINS: none   LOCAL MEDICATIONS USED:  XYLOCAINE with EPI  SPECIMEN:  No Specimen  DISPOSITION OF SPECIMEN:  N/A  COUNTS:  YES  TOURNIQUET:  * No tourniquets in log *  DICTATION: .Other Dictation: Dictation Number [id number]  PLAN OF CARE: Discharge to home after PACU  PATIENT DISPOSITION:  PACU - hemodynamically stable.   Delay start of Pharmacological VTE agent (>24hrs) due to surgical blood loss or risk of bleeding: not applicable

## 2016-11-06 NOTE — H&P (Signed)
PREOPERATIVE H&P  Chief Complaint: respiratory failure  HPI: Jesus Vega is a 83 y.o. male who presents for evaluation of acute on chronic respiratory failure for placement of tracheostomy. Patient is s/p MI in December while undergoing exploratory lap for volvulus of the sigmoid colon with necrotic colon.  A colostomy was required and the patient had mental status changes post resuscitation. He's also on dialysis and has a poor prognosis. He was subsequently [facility] and has been intubated over 3 weeks and has been unable to wean off the ventilator. Family wants to proceed with tracheostomy despite poor prognosis.  Past Medical History:  Diagnosis Date  . Cardiac arrest with ventricular fibrillation (HCC) on OR table   . CHF (congestive heart failure) (HCC)   . Dementia   . Gout   . NSTEMI (non-ST elevated myocardial infarction) (HCC)   . Pancreatitis   . Renal insufficiency   . 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 COLECTOMY    . CT PERC CHOLECYSTOSTOMY    . IR GENERIC HISTORICAL  10/02/2016   IR FLUORO GUIDE CV LINE RIGHT 10/02/2016 Malachy MoanHeath McCullough, MD [facility]  . IR GENERIC HISTORICAL  10/02/2016   IR US GUIDE VASC ACCESS RIGHT 10/02/2016 Malachy MoanHeath McCullough, MD [facility]   Social History   Social History  . Marital status: Married    Spouse name: N/A  . Number of children: N/A  . Years of education: N/A   Social History Main Topics  . Smoking status: Never Smoker  . Smokeless tobacco: Never Used  . Alcohol use No  . Drug use: No  . Sexual activity: Not Asked   Other Topics Concern  . None   Social History Narrative  . None   Family History  Problem Relation Age of Onset  . Hypertension Mother    No Known Allergies Prior to Admission medications   Not on File     Positive ROS: non responsive  All other systems have been reviewed and were otherwise  negative with the exception of those mentioned in the HPI and as above.  Physical Exam: There were no vitals filed for this visit.  General: Intubated and non-responsive Oral: Normal oral mucosa and tonsils Nasal: Clear nasal passages Neck: No palpable adenopathy or thyroid nodules. Trach midline. Cardiovascular: Regular rate and rhythm, no murmur.  Respiratory: Clear to auscultation   Assessment/Plan: ACUTE AND CHRONIC RESPRITORY FAILURE Plan for Procedure(s): TRACHEOSTOMY   Dillard CannonHRISTOPHER NEWMAN, MD 11/06/2016 7:32 AM

## 2016-11-06 NOTE — Progress Notes (Signed)
[facility]  ROUNDING NOTE   Subjective:  Patient continues to tolerate dialysis fair Anasarca persists Getting iv albumin with HD Not following commands  hypothermic Blood cultures done for sepsis work up [employer] placed  Vent 28/5   Objective:  Vital signs in last 24 hours:  temperature 94.3 pulse 81 respirations 10 blood pressure 105/61  Physical Exam: General: Critically ill-appearing  Head: NG in place,   Eyes: Anicteric  Neck: traceostomy  Lungs:  Scattered rhonchi, on vent  Heart: S1S2 no rubs  Abdomen:  Soft,   colostomy in place  Extremities: 1+ dependent edema  Neurologic: Arousable, not following commands  Skin: Warm & dry  Access: R IJ temp HD catheter    Basic Metabolic Panel:  Recent Labs Lab 11/01/16 1624 11/03/16 0610 11/05/16 0843  NA 132* 130* 129*  K 3.6 4.0 4.5  CL 95* 94* 95*  CO2 29 27 22   GLUCOSE 121* 129* 102*  BUN 34* 53* 48*  CREATININE 2.04* 2.91* 2.86*  CALCIUM 7.7* 7.8* 7.9*  PHOS 1.5* 2.2*  --     Liver Function Tests:  Recent Labs Lab 11/01/16 1624 11/03/16 0610  ALBUMIN 1.8* 1.6*   No results for input(s): LIPASE, AMYLASE in the last 168 hours. No results for input(s): AMMONIA in the last 168 hours.  CBC:  Recent Labs Lab 11/01/16 1650 11/03/16 0610 11/05/16 0843 11/06/16 1200  WBC 18.1* 18.1* 22.3* 22.7*  HGB 6.8* 7.3* 8.6* 7.0*  HCT 21.9* 23.4* 27.8* 22.6*  MCV 92.4 93.2 96.5 94.2  PLT 181 182 154 154    Cardiac Enzymes: No results for input(s): CKTOTAL, CKMB, CKMBINDEX, TROPONINI in the last 168 hours.  BNP: Invalid input(s): POCBNP  CBG: No results for input(s): GLUCAP in the last 168 hours.  Microbiology: Results for orders placed or performed during the hospital encounter of 09/30/16  Culture, blood (routine x 2)     Status: None   Collection Time: 10/06/16  4:19 PM  Result Value Ref Range Status   Specimen Description BLOOD HEMODIALYSIS CATHETER  Final   Special Requests  BOTTLES DRAWN AEROBIC ONLY 5CC  Final   Culture NO GROWTH 5 DAYS  Final   Report Status 10/11/2016 FINAL  Final  Culture, blood (routine x 2)     Status: None   Collection Time: 10/06/16  4:20 PM  Result Value Ref Range Status   Specimen Description BLOOD HEMODIALYSIS CATHETER  Final   Special Requests BOTTLES DRAWN AEROBIC ONLY 10CC  Final   Culture NO GROWTH 5 DAYS  Final   Report Status 10/11/2016 FINAL  Final  Culture, respiratory (NON-Expectorated)     Status: None   Collection Time: 10/27/16  2:03 PM  Result Value Ref Range Status   Specimen Description TRACHEAL ASPIRATE  Final   Special Requests NONE  Final   Gram Stain   Final    ABUNDANT WBC PRESENT, PREDOMINANTLY MONONUCLEAR ABUNDANT GRAM POSITIVE COCCI IN PAIRS ABUNDANT GRAM NEGATIVE COCCI IN PAIRS    Culture   Final    MODERATE METHICILLIN RESISTANT STAPHYLOCOCCUS AUREUS   Report Status 10/30/2016 FINAL  Final   Organism ID, Bacteria METHICILLIN RESISTANT STAPHYLOCOCCUS AUREUS  Final      Susceptibility   Methicillin resistant staphylococcus aureus - MIC*    CIPROFLOXACIN >=8 RESISTANT Resistant     ERYTHROMYCIN >=8 RESISTANT Resistant     GENTAMICIN <=0.5 SENSITIVE Sensitive     OXACILLIN >=4 RESISTANT Resistant     TETRACYCLINE <=1 SENSITIVE Sensitive  VANCOMYCIN 1 SENSITIVE Sensitive     TRIMETH/SULFA <=10 SENSITIVE Sensitive     CLINDAMYCIN <=0.25 SENSITIVE Sensitive     RIFAMPIN <=0.5 SENSITIVE Sensitive     Inducible Clindamycin NEGATIVE Sensitive     * MODERATE METHICILLIN RESISTANT STAPHYLOCOCCUS AUREUS    Coagulation Studies: No results for input(s): LABPROT, INR in the last 72 hours.  Urinalysis: No results for input(s): COLORURINE, LABSPEC, PHURINE, GLUCOSEU, HGBUR, BILIRUBINUR, KETONESUR, PROTEINUR, UROBILINOGEN, NITRITE, LEUKOCYTESUR in the last 72 hours.  Invalid input(s): APPERANCEUR    Imaging: No results found.   Medications:       Assessment/ Plan:  83 y.o.  African American  male with a PMHx of dementia, chronic kidney disease stage IV, congestive heart failure ejection fraction 25-30%, who was admitted to [facility] on 09/30/2016 for ongoing management of cholecystitis and volvulus status post exploratory laparotomy with colectomy. Patient was previously in [place]. [facility]  1.  Acute renal failure. 2.  CKD stage IV. 3.  Acute respiratory failure., vent dependent 4.  Metabolic acidosis. 5.  Anemia of CKD.  6.  Anasarca      Plan:   we will continue the patient on a Monday, Wednesday, Friday dialysis schedule. We will prepare orders for Wednesday. Ultrafiltration target on Monday will be 3-4 kg. Hemoglobin currently 7.0. Continue the patient on Aranesp per protocol.  Phosphorus remains under good control at 2.2. Overall patient continues to have a very guarded prognosis however. Requiring iv albumin with HD for fluid removal D/c Nacl tablets Nearing ESRD as he remains dialysis dependent   LOS: 0 Jesus Vega 2/26/20184:38 PM

## 2016-11-06 NOTE — Transfer of Care (Signed)
Immediate Anesthesia Transfer of Care Note  Patient: Jesus Vega  Procedure(s) Performed: Procedure(s): TRACHEOSTOMY (N/A)  Patient Location: Nursing Unit  Anesthesia Type:General  Level of Consciousness: responds to stimulation  Airway & Oxygen Therapy: Patient remains intubated per anesthesia plan and Patient placed on Ventilator (see vital sign flow sheet for setting)  Post-op Assessment: Report given to RN and Post -op Vital signs reviewed and stable  Post vital signs: Reviewed and stable  Last Vitals: There were no vitals filed for this visit.  Last Pain: There were no vitals filed for this visit.       Complications: No apparent anesthesia complications

## 2016-11-06 NOTE — Interval H&P Note (Signed)
History and Physical Interval Note:  11/06/2016 7:44 AM  Jesus Vega  has presented today for surgery, with the diagnosis of ACUTE AND CHRONIC RESPRITORY FAILURE  The various methods of treatment have been discussed with the patient and family. After consideration of risks, benefits and other options for treatment, the patient has consented to  Procedure(s): TRACHEOSTOMY (N/A) as a surgical intervention .  The patient's history has been reviewed, patient examined, no change in status, stable for surgery.  I have reviewed the patient's chart and labs.  Questions were answered to the patient's satisfaction.     [name]

## 2016-11-07 ENCOUNTER — Other Ambulatory Visit (HOSPITAL_COMMUNITY): Payer: Medicare Other

## 2016-11-07 ENCOUNTER — Encounter (HOSPITAL_COMMUNITY): Payer: Self-pay | Admitting: Otolaryngology

## 2016-11-07 NOTE — Op Note (Signed)
NAME:  Jesus BlankCAIN, [name]                        ACCOUNT NO.:  MEDICAL RECORD NO.:  [id number]  LOCATION:                                 FACILITY:  PHYSICIAN:  Kristine GarbeChristopher E. Ezzard StandingNewman, M.D. DATE OF BIRTH:  DATE OF PROCEDURE:  11/06/2016 DATE OF DISCHARGE:                              OPERATIVE REPORT   PREOPERATIVE DIAGNOSIS:  Acute and chronic respiratory failure.  POSTOPERATIVE DIAGNOSIS:  Acute and chronic respiratory failure.  OPERATION PERFORMED:  Tracheostomy with a #8 Shiley tracheotomy tube.  SURGEON:  Kristine GarbeChristopher E. Ezzard StandingNewman, M.D.  ANESTHESIA:  General endotracheal.  COMPLICATIONS:  None.  ESTIMATED BLOOD LOSS:  Minimal.  BRIEF CLINICAL NOTE:  Jesus Vega is an 83 year old gentleman who is 2 months status post MI following exploratory laparotomy for sigmoid volvulus that required colostomy.  Post resuscitation he had mental status changes.  He also has chronic renal failure, on dialysis.  He was subsequently transferred to [facility] for long-term care.  He has been intubated for over 2 weeks and is recommended tracheotomy despite very poor prognosis.  Family is agreeable with tracheotomy.  DESCRIPTION OF PROCEDURE:  The patient was brought straight down to the [facility] room [facility]  The patient was then left in his bed.  His rolls were under his shoulders to extend his neck. The proposed incision site was marked out and injected with 4 mL Xylocaine with epinephrine.  A vertical incision was made just above the suprasternal notch in the midline.  Dissection was carried down through subcutaneous tissue.  Strap muscles were divided vertically in midline and retracted laterally.  The thyroid isthmus sat fairly high and had to be divided with cautery.  Hemostasis was obtained with cautery.  Cricoid hook was placed to the cricoid cartilage to elevate the trachea.  The first two tracheal rings were exposed.  A horizontal tracheotomy was performed  between the first and second tracheal ring.  The endotracheal tube was removed and a #8 Shiley tube was inserted without any difficulty.  The patient was ventilated well and a tracheotomy was secured with 2-0 silk sutures x4 and Velcro trach collar around the neck.  This completed the procedure.  Jesus Vega was subsequent [facility].          ______________________________ Kristine Garbehristopher E. Ezzard StandingNewman, M.D.     CEN/MEDQ  D:  11/06/2016  T:  11/06/2016  Job:  [id number]

## 2016-11-08 LAB — RENAL FUNCTION PANEL
Albumin: 2.2 g/dL — ABNORMAL LOW (ref 3.5–5.0)
Anion gap: 9 (ref 5–15)
BUN: 27 mg/dL — AB (ref 6–20)
CALCIUM: 8.3 mg/dL — AB (ref 8.9–10.3)
CO2: 26 mmol/L (ref 22–32)
CREATININE: 2.16 mg/dL — AB (ref 0.61–1.24)
Chloride: 99 mmol/L — ABNORMAL LOW (ref 101–111)
GFR calc Af Amer: 31 mL/min — ABNORMAL LOW (ref >60)
GFR, EST NON AFRICAN AMERICAN: 27 mL/min — AB (ref >60)
GLUCOSE: 131 mg/dL — AB (ref 65–99)
PHOSPHORUS: 1.9 mg/dL — AB (ref 2.5–4.6)
Potassium: 3.9 mmol/L (ref 3.5–5.1)
SODIUM: 134 mmol/L — AB (ref 135–145)

## 2016-11-08 LAB — CBC
HEMATOCRIT: 24.2 % — AB (ref 39.0–52.0)
Hemoglobin: 7.5 g/dL — ABNORMAL LOW (ref 13.0–17.0)
MCH: 29.2 pg (ref 26.0–34.0)
MCHC: 31 g/dL (ref 30.0–36.0)
MCV: 94.2 fL (ref 78.0–100.0)
PLATELETS: 204 10*3/uL (ref 150–400)
RBC: 2.57 MIL/uL — ABNORMAL LOW (ref 4.22–5.81)
RDW: 20 % — AB (ref 11.5–15.5)
WBC: 15.9 10*3/uL — ABNORMAL HIGH (ref 4.0–10.5)

## 2016-11-08 NOTE — Progress Notes (Signed)
[facility]  ROUNDING NOTE   Subjective:  Patient continues to remains critically ill Anasarca persists Getting iv albumin with HD Not following commands  4.3L of fluid was removed with dialysis today    Objective:  Vital signs in last 24 hours:  temperature 8.4, pulse 75, respirations 20, blood pressure 165/73  Physical Exam: General: Critically ill-appearing  Head: NG in place,   Eyes: Anicteric  Neck: traceostomy  Lungs:  Scattered rhonchi, on vent  Heart: S1S2 no rubs  Abdomen:  Soft,   colostomy in place  Extremities: 1+ dependent edema  Neurologic: Arousable, not following commands  Skin: Warm & dry  Access: R IJ temp HD catheter    Basic Metabolic Panel:  Recent Labs Lab 11/01/16 1624 11/03/16 0610 11/05/16 0843 11/08/16 0500  NA 132* 130* 129* 134*  K 3.6 4.0 4.5 3.9  CL 95* 94* 95* 99*  CO2 29 27 22 26   GLUCOSE 121* 129* 102* 131*  BUN 34* 53* 48* 27*  CREATININE 2.04* 2.91* 2.86* 2.16*  CALCIUM 7.7* 7.8* 7.9* 8.3*  PHOS 1.5* 2.2*  --  1.9*    Liver Function Tests:  Recent Labs Lab 11/01/16 1624 11/03/16 0610 11/08/16 0500  ALBUMIN 1.8* 1.6* 2.2*   No results for input(s): LIPASE, AMYLASE in the last 168 hours. No results for input(s): AMMONIA in the last 168 hours.  CBC:  Recent Labs Lab 11/03/16 0610 11/05/16 0843 11/06/16 1200 11/06/16 1858 11/08/16 1030  WBC 18.1* 22.3* 22.7* 16.4* 15.9*  HGB 7.3* 8.6* 7.0* 6.3* 7.5*  HCT 23.4* 27.8* 22.6* 20.0* 24.2*  MCV 93.2 96.5 94.2 92.6 94.2  PLT 182 154 154 153 204    Cardiac Enzymes: No results for input(s): CKTOTAL, CKMB, CKMBINDEX, TROPONINI in the last 168 hours.  BNP: Invalid input(s): POCBNP  CBG: No results for input(s): GLUCAP in the last 168 hours.  Microbiology: Results for orders placed or performed during the hospital encounter of 09/30/16  Culture, blood (routine x 2)     Status: None   Collection Time: 10/06/16  4:19 PM  Result Value Ref Range  Status   Specimen Description BLOOD HEMODIALYSIS CATHETER  Final   Special Requests BOTTLES DRAWN AEROBIC ONLY 5CC  Final   Culture NO GROWTH 5 DAYS  Final   Report Status 10/11/2016 FINAL  Final  Culture, blood (routine x 2)     Status: None   Collection Time: 10/06/16  4:20 PM  Result Value Ref Range Status   Specimen Description BLOOD HEMODIALYSIS CATHETER  Final   Special Requests BOTTLES DRAWN AEROBIC ONLY 10CC  Final   Culture NO GROWTH 5 DAYS  Final   Report Status 10/11/2016 FINAL  Final  Culture, respiratory (NON-Expectorated)     Status: None   Collection Time: 10/27/16  2:03 PM  Result Value Ref Range Status   Specimen Description TRACHEAL ASPIRATE  Final   Special Requests NONE  Final   Gram Stain   Final    ABUNDANT WBC PRESENT, PREDOMINANTLY MONONUCLEAR ABUNDANT GRAM POSITIVE COCCI IN PAIRS ABUNDANT GRAM NEGATIVE COCCI IN PAIRS    Culture   Final    MODERATE METHICILLIN RESISTANT STAPHYLOCOCCUS AUREUS   Report Status 10/30/2016 FINAL  Final   Organism ID, Bacteria METHICILLIN RESISTANT STAPHYLOCOCCUS AUREUS  Final      Susceptibility   Methicillin resistant staphylococcus aureus - MIC*    CIPROFLOXACIN >=8 RESISTANT Resistant     ERYTHROMYCIN >=8 RESISTANT Resistant     GENTAMICIN <=0.5 SENSITIVE Sensitive  OXACILLIN >=4 RESISTANT Resistant     TETRACYCLINE <=1 SENSITIVE Sensitive     VANCOMYCIN 1 SENSITIVE Sensitive     TRIMETH/SULFA <=10 SENSITIVE Sensitive     CLINDAMYCIN <=0.25 SENSITIVE Sensitive     RIFAMPIN <=0.5 SENSITIVE Sensitive     Inducible Clindamycin NEGATIVE Sensitive     * MODERATE METHICILLIN RESISTANT STAPHYLOCOCCUS AUREUS  Culture, blood (routine x 2)     Status: None (Preliminary result)   Collection Time: 11/06/16  6:50 PM  Result Value Ref Range Status   Specimen Description BLOOD RIGHT HAND  Final   Special Requests IN PEDIATRIC BOTTLE 2CC  Final   Culture NO GROWTH 2 DAYS  Final   Report Status PENDING  Incomplete  Culture, blood  (routine x 2)     Status: None (Preliminary result)   Collection Time: 11/06/16  7:00 PM  Result Value Ref Range Status   Specimen Description BLOOD LEFT HAND  Final   Special Requests BOTTLES DRAWN AEROBIC AND ANAEROBIC 5CC  Final   Culture NO GROWTH 2 DAYS  Final   Report Status PENDING  Incomplete    Coagulation Studies: No results for input(s): LABPROT, INR in the last 72 hours.  Urinalysis: No results for input(s): COLORURINE, LABSPEC, PHURINE, GLUCOSEU, HGBUR, BILIRUBINUR, KETONESUR, PROTEINUR, UROBILINOGEN, NITRITE, LEUKOCYTESUR in the last 72 hours.  Invalid input(s): APPERANCEUR    Imaging: Dg Chest Port 1 View  Result Date: 11/07/2016 CLINICAL DATA:  Respiratory failure EXAM: PORTABLE CHEST 1 VIEW COMPARISON:  10/27/2016 FINDINGS: Cardiomegaly again noted. Tracheostomy tube in place. Stable NG tube position. Stable right IJ central line position. Central vascular congestion and mild interstitial prominence bilaterally highly suspicious for pulmonary edema. Persistent bilateral small pleural effusion with bilateral basilar atelectasis or infiltrate. No pneumothorax. IMPRESSION: Tracheostomy tube in place. Stable NG tube position. Stable right IJ central line position. Central vascular congestion and mild interstitial prominence bilaterally highly suspicious for pulmonary edema. Persistent bilateral small pleural effusion with bilateral basilar atelectasis or infiltrate. No pneumothorax. Electronically Signed   By: Natasha Mead M.D.   On: 11/07/2016 16:28     Medications:       Assessment/ Plan:  83 y.o.  African American male with a PMHx of dementia, chronic kidney disease stage IV, congestive heart failure ejection fraction 25-30%, who was admitted to [facility] on 09/30/2016 for ongoing management of cholecystitis and volvulus status post exploratory laparotomy with colectomy. Patient was previously in [place]. Dialysis started at [facility] around 10/03/2016  1.   Acute renal failure, progressed to ESRD. ATN no recovery  2.  Anasarca   3.  Acute respiratory failure., vent dependent 4.  Metabolic acidosis. 5.  Anemia of CKD.      Plan:  We will continue the patient on a Monday, Wednesday, Friday dialysis schedule. We will prepare orders for dialysis Ultrafiltration target  will be 3-4 kg. Hemoglobin currently 7.5  Continue the patient on Aranesp per protocol.  Phosphorus remains under good control at 1.9. Overall patient continues to have a very guarded prognosis however. Requiring iv albumin with HD for fluid removal D/c Nacl tablets     LOS: 0 Jesus Vega 2/28/20183:55 PM

## 2016-11-09 LAB — BLOOD CULTURE ID PANEL (REFLEXED)
ACINETOBACTER BAUMANNII: NOT DETECTED
CANDIDA ALBICANS: NOT DETECTED
CANDIDA GLABRATA: NOT DETECTED
CANDIDA KRUSEI: NOT DETECTED
CANDIDA PARAPSILOSIS: NOT DETECTED
Candida tropicalis: NOT DETECTED
ENTEROBACTER CLOACAE COMPLEX: NOT DETECTED
ENTEROBACTERIACEAE SPECIES: NOT DETECTED
ENTEROCOCCUS SPECIES: NOT DETECTED
ESCHERICHIA COLI: NOT DETECTED
Haemophilus influenzae: NOT DETECTED
KLEBSIELLA OXYTOCA: NOT DETECTED
Klebsiella pneumoniae: NOT DETECTED
LISTERIA MONOCYTOGENES: NOT DETECTED
Neisseria meningitidis: NOT DETECTED
PSEUDOMONAS AERUGINOSA: NOT DETECTED
Proteus species: NOT DETECTED
STREPTOCOCCUS PNEUMONIAE: NOT DETECTED
STREPTOCOCCUS PYOGENES: NOT DETECTED
Serratia marcescens: NOT DETECTED
Staphylococcus aureus (BCID): NOT DETECTED
Staphylococcus species: NOT DETECTED
Streptococcus agalactiae: NOT DETECTED
Streptococcus species: NOT DETECTED

## 2016-11-10 LAB — CBC WITH DIFFERENTIAL/PLATELET
BASOS ABS: 0 10*3/uL (ref 0.0–0.1)
Basophils Relative: 0 %
EOS PCT: 2 %
Eosinophils Absolute: 0.3 10*3/uL (ref 0.0–0.7)
HEMATOCRIT: 22.5 % — AB (ref 39.0–52.0)
HEMOGLOBIN: 7 g/dL — AB (ref 13.0–17.0)
LYMPHS PCT: 11 %
Lymphs Abs: 1.6 10*3/uL (ref 0.7–4.0)
MCH: 29.5 pg (ref 26.0–34.0)
MCHC: 31.1 g/dL (ref 30.0–36.0)
MCV: 94.9 fL (ref 78.0–100.0)
MONOS PCT: 6 %
Monocytes Absolute: 0.9 10*3/uL (ref 0.1–1.0)
NEUTROS ABS: 12 10*3/uL — AB (ref 1.7–7.7)
Neutrophils Relative %: 81 %
Platelets: 186 10*3/uL (ref 150–400)
RBC: 2.37 MIL/uL — ABNORMAL LOW (ref 4.22–5.81)
RDW: 20.5 % — ABNORMAL HIGH (ref 11.5–15.5)
WBC: 14.8 10*3/uL — ABNORMAL HIGH (ref 4.0–10.5)

## 2016-11-10 LAB — RENAL FUNCTION PANEL
ALBUMIN: 1.7 g/dL — AB (ref 3.5–5.0)
ANION GAP: 10 (ref 5–15)
BUN: 49 mg/dL — AB (ref 6–20)
CHLORIDE: 97 mmol/L — AB (ref 101–111)
CO2: 26 mmol/L (ref 22–32)
Calcium: 7.9 mg/dL — ABNORMAL LOW (ref 8.9–10.3)
Creatinine, Ser: 3.41 mg/dL — ABNORMAL HIGH (ref 0.61–1.24)
GFR calc Af Amer: 18 mL/min — ABNORMAL LOW (ref >60)
GFR calc non Af Amer: 15 mL/min — ABNORMAL LOW (ref >60)
Glucose, Bld: 157 mg/dL — ABNORMAL HIGH (ref 65–99)
PHOSPHORUS: 2.4 mg/dL — AB (ref 2.5–4.6)
POTASSIUM: 4 mmol/L (ref 3.5–5.1)
Sodium: 133 mmol/L — ABNORMAL LOW (ref 135–145)

## 2016-11-10 NOTE — Progress Notes (Signed)
[facility]  ROUNDING NOTE   Subjective:  Patient continues to remains critically ill Anasarca persists Getting iv albumin with HD Not following commands Goal fluid removal 3-4 kg with HD Vent dependent Fio2 28% TF @ 40 cc/hr   Objective:  Vital signs in last 24 hours:  Temperature 98.4, pulse 94, respiration 18, blood pressure 106/69  Physical Exam: General: Critically ill-appearing  Head: NG in place,   Eyes: Anicteric  Neck: traceostomy  Lungs:  Scattered rhonchi, on vent  Heart: S1S2 no rubs  Abdomen:  Soft,   colostomy in place  Extremities: 1+ dependent edema  Neurologic: Arousable, not following commands  Skin: Warm & dry  Access: R IJ temp HD catheter    Basic Metabolic Panel:  Recent Labs Lab 11/05/16 0843 11/08/16 0500 11/10/16 0500  NA 129* 134* 133*  K 4.5 3.9 4.0  CL 95* 99* 97*  CO2 22 26 26   GLUCOSE 102* 131* 157*  BUN 48* 27* 49*  CREATININE 2.86* 2.16* 3.41*  CALCIUM 7.9* 8.3* 7.9*  PHOS  --  1.9* 2.4*    Liver Function Tests:  Recent Labs Lab 11/08/16 0500 11/10/16 0500  ALBUMIN 2.2* 1.7*   No results for input(s): LIPASE, AMYLASE in the last 168 hours. No results for input(s): AMMONIA in the last 168 hours.  CBC:  Recent Labs Lab 11/05/16 0843 11/06/16 1200 11/06/16 1858 11/08/16 1030 11/10/16 0500  WBC 22.3* 22.7* 16.4* 15.9* 14.8*  NEUTROABS  --   --   --   --  12.0*  HGB 8.6* 7.0* 6.3* 7.5* 7.0*  HCT 27.8* 22.6* 20.0* 24.2* 22.5*  MCV 96.5 94.2 92.6 94.2 94.9  PLT 154 154 153 204 186    Cardiac Enzymes: No results for input(s): CKTOTAL, CKMB, CKMBINDEX, TROPONINI in the last 168 hours.  BNP: Invalid input(s): POCBNP  CBG: No results for input(s): GLUCAP in the last 168 hours.  Microbiology: Results for orders placed or performed during the hospital encounter of 09/30/16  Culture, blood (routine x 2)     Status: None   Collection Time: 10/06/16  4:19 PM  Result Value Ref Range Status   Specimen Description BLOOD HEMODIALYSIS CATHETER  Final   Special Requests BOTTLES DRAWN AEROBIC ONLY 5CC  Final   Culture NO GROWTH 5 DAYS  Final   Report Status 10/11/2016 FINAL  Final  Culture, blood (routine x 2)     Status: None   Collection Time: 10/06/16  4:20 PM  Result Value Ref Range Status   Specimen Description BLOOD HEMODIALYSIS CATHETER  Final   Special Requests BOTTLES DRAWN AEROBIC ONLY 10CC  Final   Culture NO GROWTH 5 DAYS  Final   Report Status 10/11/2016 FINAL  Final  Culture, respiratory (NON-Expectorated)     Status: None   Collection Time: 10/27/16  2:03 PM  Result Value Ref Range Status   Specimen Description TRACHEAL ASPIRATE  Final   Special Requests NONE  Final   Gram Stain   Final    ABUNDANT WBC PRESENT, PREDOMINANTLY MONONUCLEAR ABUNDANT GRAM POSITIVE COCCI IN PAIRS ABUNDANT GRAM NEGATIVE COCCI IN PAIRS    Culture   Final    MODERATE METHICILLIN RESISTANT STAPHYLOCOCCUS AUREUS   Report Status 10/30/2016 FINAL  Final   Organism ID, Bacteria METHICILLIN RESISTANT STAPHYLOCOCCUS AUREUS  Final      Susceptibility   Methicillin resistant staphylococcus aureus - MIC*    CIPROFLOXACIN >=8 RESISTANT Resistant     ERYTHROMYCIN >=8 RESISTANT Resistant  GENTAMICIN <=0.5 SENSITIVE Sensitive     OXACILLIN >=4 RESISTANT Resistant     TETRACYCLINE <=1 SENSITIVE Sensitive     VANCOMYCIN 1 SENSITIVE Sensitive     TRIMETH/SULFA <=10 SENSITIVE Sensitive     CLINDAMYCIN <=0.25 SENSITIVE Sensitive     RIFAMPIN <=0.5 SENSITIVE Sensitive     Inducible Clindamycin NEGATIVE Sensitive     * MODERATE METHICILLIN RESISTANT STAPHYLOCOCCUS AUREUS  Culture, blood (routine x 2)     Status: None (Preliminary result)   Collection Time: 11/06/16  6:50 PM  Result Value Ref Range Status   Specimen Description BLOOD RIGHT HAND  Final   Special Requests IN PEDIATRIC BOTTLE 2CC  Final   Culture NO GROWTH 3 DAYS  Final   Report Status PENDING  Incomplete  Culture, blood (routine  x 2)     Status: Abnormal (Preliminary result)   Collection Time: 11/06/16  7:00 PM  Result Value Ref Range Status   Specimen Description BLOOD LEFT HAND  Final   Special Requests BOTTLES DRAWN AEROBIC AND ANAEROBIC 5CC  Final   Culture  Setup Time   Final    BUDDING YEAST SEEN AEROBIC BOTTLE ONLY CRITICAL RESULT CALLED TO, READ BACK BY AND VERIFIED WITH: N SMITH,RN AT 1707 11/09/16 BY L BENFIELD    Culture YEAST (A)  Final   Report Status PENDING  Incomplete  Blood Culture ID Panel (Reflexed)     Status: None   Collection Time: 11/06/16  7:00 PM  Result Value Ref Range Status   Enterococcus species NOT DETECTED NOT DETECTED Final   Listeria monocytogenes NOT DETECTED NOT DETECTED Final   Staphylococcus species NOT DETECTED NOT DETECTED Final   Staphylococcus aureus NOT DETECTED NOT DETECTED Final   Streptococcus species NOT DETECTED NOT DETECTED Final   Streptococcus agalactiae NOT DETECTED NOT DETECTED Final   Streptococcus pneumoniae NOT DETECTED NOT DETECTED Final   Streptococcus pyogenes NOT DETECTED NOT DETECTED Final   Acinetobacter baumannii NOT DETECTED NOT DETECTED Final   Enterobacteriaceae species NOT DETECTED NOT DETECTED Final   Enterobacter cloacae complex NOT DETECTED NOT DETECTED Final   Escherichia coli NOT DETECTED NOT DETECTED Final   Klebsiella oxytoca NOT DETECTED NOT DETECTED Final   Klebsiella pneumoniae NOT DETECTED NOT DETECTED Final   Proteus species NOT DETECTED NOT DETECTED Final   Serratia marcescens NOT DETECTED NOT DETECTED Final   Haemophilus influenzae NOT DETECTED NOT DETECTED Final   Neisseria meningitidis NOT DETECTED NOT DETECTED Final   Pseudomonas aeruginosa NOT DETECTED NOT DETECTED Final   Candida albicans NOT DETECTED NOT DETECTED Final   Candida glabrata NOT DETECTED NOT DETECTED Final   Candida krusei NOT DETECTED NOT DETECTED Final   Candida parapsilosis NOT DETECTED NOT DETECTED Final   Candida tropicalis NOT DETECTED NOT DETECTED  Final    Coagulation Studies: No results for input(s): LABPROT, INR in the last 72 hours.  Urinalysis: No results for input(s): COLORURINE, LABSPEC, PHURINE, GLUCOSEU, HGBUR, BILIRUBINUR, KETONESUR, PROTEINUR, UROBILINOGEN, NITRITE, LEUKOCYTESUR in the last 72 hours.  Invalid input(s): APPERANCEUR    Imaging: No results found.   Medications:       Assessment/ Plan:  83 y.o.  African American male with a PMHx of dementia, chronic kidney disease stage IV, congestive heart failure ejection fraction 25-30%, who was admitted to [facility] on 09/30/2016 for ongoing management of cholecystitis and volvulus status post exploratory laparotomy with colectomy. Patient was previously in [place]. Dialysis started at [facility] around 10/03/2016  1.  ESRD. ATN no  recovery  2.  Anasarca   3.  Acute respiratory failure., vent dependent 4.  Metabolic acidosis. 5.  Anemia of CKD.  6. Hyponatremia - volume overload      Plan:  We will continue the patient on a Monday, Wednesday, Friday dialysis schedule. We will prepare orders for dialysis Ultrafiltration target  will be 3-4 kg. Hemoglobin currently 7.0  Continue the patient on Aranesp per protocol.  Phosphorus remains under good control at 2.4 Overall patient continues to have a very guarded prognosis however. Requiring iv albumin with HD for fluid removal      LOS: 0 Jesus Vega 3/2/20189:15 AM

## 2016-11-10 NOTE — Consult Note (Signed)
Chief Complaint: Patient was seen in consultation today for percutaneous gastric tube placement at the request of Dr Ardeth Sportsman  Referring Physician(s): Dr Ardeth Sportsman  Supervising Physician: Oley Balm  Patient Status: [facility] - In-pt  History of Present Illness: Jesus Vega is a 83 y.o. male   Pt transferred from [facility] to [facility] few weeks ago for vent management Long term care Volvulus; Necrotic bowel; post colectomy; colostomy Post percutaneous cholecystostomy drain placement ?date---before 09/29/16 for sure CHF; dementia; CAD/MI Pancreatitis 1/4 BC+ for fungus 2/26 Trach at [facility] 2/26 Dysphagia Malnutrition Deconditioning Request for percutaneous gastric tube placement  Imaging reviewed with Dr Deanne Coffer Procedure approved    Past Medical History:  Diagnosis Date  . Cardiac arrest with ventricular fibrillation (HCC) on OR table   . CHF (congestive heart failure) (HCC)   . Dementia   . Gout   . NSTEMI (non-ST elevated myocardial infarction) (HCC)   . Pancreatitis   . Renal insufficiency   . 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 COLECTOMY    . CT PERC CHOLECYSTOSTOMY    . IR GENERIC HISTORICAL  10/02/2016   IR FLUORO GUIDE CV LINE RIGHT 10/02/2016 Malachy Moan, MD [facility]-INTERV RAD  . IR GENERIC HISTORICAL  10/02/2016   IR US GUIDE VASC ACCESS RIGHT 10/02/2016 Malachy Moan, MD [facility]INTER[facility]  . TRACHEOSTOMY TUBE PLACEMENT N/A 11/06/2016   Procedure: TRACHEOSTOMY;  Surgeon: Drema Halon, MD;  Location: [facility] OR;  Service: ENT;  Laterality: N/A;    Allergies: Patient has no known allergies.  Medications: Prior to Admission medications   Not on File     Family History  Problem Relation Age of Onset  . Hypertension Mother     Social History   Social History  . Marital status: Married    Spouse name: N/A  . Number of children: N/A  . Years of education: N/A   Social  History Main Topics  . Smoking status: Never Smoker  . Smokeless tobacco: Never Used  . Alcohol use No  . Drug use: No  . Sexual activity: Not Asked   Other Topics Concern  . None   Social History Narrative  . None    Review of Systems: A 12 point ROS discussed and pertinent positives are indicated in the HPI above.  All other systems are negative.  Review of Systems  Respiratory:       Trach/vent  Psychiatric/Behavioral: Negative for agitation.    Vital Signs: There were no vitals taken for this visit.  Physical Exam  Constitutional:  Ill appearing Trach/vent  Cardiovascular: Normal rate and regular rhythm.   Pulmonary/Chest:  vent  Abdominal: Soft.  Musculoskeletal:  arouseable No movement Does not follow commands  Skin: Skin is warm.  Nursing note and vitals reviewed.   Mallampati Score:  MD Evaluation Airway: Other (comments) Airway comments: trach Heart: WNL Abdomen: WNL Chest/ Lungs: Other (comments) Chest/ lungs comments: vent ASA  Classification: 4 Mallampati/Airway Score: Three  Imaging: Ct Abdomen Pelvis W Contrast  Result Date: 10/23/2016 CLINICAL DATA:  Pt has a PMHx of dementia, chronic kidney disease stage IV, congestive heart failure, scan today is for an abdominal abscess. He has been okayed to have IV contrast per Dr Cherylann Ratel (nephrology) Isovue 300 80 ml EXAM: CT ABDOMEN AND PELVIS WITH CONTRAST TECHNIQUE: Multidetector CT imaging of the abdomen and pelvis was performed using the standard protocol following bolus administration  of intravenous contrast. CONTRAST:  80 cc Isovue 300 COMPARISON:  10/11/2016 plain films FINDINGS: Lower chest: There are moderate bilateral pleural effusions. Bibasilar atelectasis is present. There are coronary artery calcifications. The heart is mildly enlarged. Hepatobiliary: Cholecystostomy tube 2 is in place. No discrete liver lesions. There is mild periportal edema. Pancreas: Unremarkable. No pancreatic ductal  dilatation or surrounding inflammatory changes. Spleen: Normal in size without focal abnormality. Adrenals/Urinary Tract: The adrenal glands are normal in appearance. The kidneys are diminutive, measuring 8.4 cm on the right and 8.9 cm on the left. There are calcifications in the kidneys bilaterally which may represent vascular or urinary tract calcifications. No hydronephrosis. A midpole left renal cyst measures 1 cm. Urinary bladder is decompressed. Stomach/Bowel: A nasogastric tube is present in the stomach. Status post diverting colostomy to the left mid abdomen. No evidence for bowel obstruction or bowel wall thickening. Ostomy site is unremarkable. Vascular/Lymphatic: There is dense atherosclerotic calcification of the abdominal aorta and its branches. No retroperitoneal or mesenteric adenopathy. Reproductive: Prostate is unremarkable. Other: There is moderate free pelvic fluid. Diffuse body wall edema. Musculoskeletal: Scoliosis and associated spondylosis of the thoracolumbar spine. Other: Study quality is degraded by artifact from the patient's arms at the side. IMPRESSION: 1. Marked third-spacing; bilateral pleural effusions, ascites, and significant body wall edema. 2. Bilateral lower lobe atelectasis. 3. Coronary artery disease. 4. No evidence for intra-abdominal abscess. 5. Cholecystostomy tube. 6. Diminutive kidneys. Bilateral renal calcifications possibly representing vascular calcifications or calculi. 7. Left renal cyst. 8. Status post diverting colostomy to left mid abdomen. Electronically Signed   By: Norva Pavlov M.D.   On: 10/23/2016 18:56   US Abdomen Limited  Result Date: 10/23/2016 CLINICAL DATA:  Acute onset of abnormal LFTs.  Initial encounter. EXAM: US ABDOMEN LIMITED - RIGHT UPPER QUADRANT COMPARISON:  CT of the abdomen and pelvis performed earlier today at 5:37 p.m. FINDINGS: Gallbladder: Contracted about the patient's cholecystostomy tube, and difficult to fully characterize. No  ultrasonographic Murphy's sign elicited. Common bile duct: Diameter: 0.5 cm, within normal limits in caliber. Liver: No focal lesion identified. Within normal limits in parenchymal echogenicity. Left hepatic lobe not well characterized due to overlying bowel gas. A small right pleural effusion is again noted. IMPRESSION: 1. No acute abnormality seen within the liver, though the left hepatic lobe is not well characterized. 2. Gallbladder contracted about the patient's cholecystostomy tube and difficult to fully characterize, though grossly unremarkable in appearance. 3. Small right pleural effusion again noted. Electronically Signed   By: Roanna Raider M.D.   On: 10/23/2016 22:31   Dg Chest Port 1 View  Result Date: 11/07/2016 CLINICAL DATA:  Respiratory failure EXAM: PORTABLE CHEST 1 VIEW COMPARISON:  10/27/2016 FINDINGS: Cardiomegaly again noted. Tracheostomy tube in place. Stable NG tube position. Stable right IJ central line position. Central vascular congestion and mild interstitial prominence bilaterally highly suspicious for pulmonary edema. Persistent bilateral small pleural effusion with bilateral basilar atelectasis or infiltrate. No pneumothorax. IMPRESSION: Tracheostomy tube in place. Stable NG tube position. Stable right IJ central line position. Central vascular congestion and mild interstitial prominence bilaterally highly suspicious for pulmonary edema. Persistent bilateral small pleural effusion with bilateral basilar atelectasis or infiltrate. No pneumothorax. Electronically Signed   By: Natasha Mead M.D.   On: 11/07/2016 16:28   Dg Chest Port 1 View  Result Date: 10/27/2016 CLINICAL DATA:  Respiratory failure, swelling in arms, legs and abdominal area. Presently on dialysis. EXAM: PORTABLE CHEST 1 VIEW COMPARISON:  10/23/2016 FINDINGS: Hazy  airspace opacity extends from the mid lungs becoming more confluent at the lung bases. There bilateral pleural effusions. Pleural fluid on the right  appears increased from previous exam. There is no convincing change on the left. Cardiac silhouette is obscured by the contiguous lung opacity. No pneumothorax. The endotracheal tube tip projects 2.8 cm above that carina. The right sided dual-lumen central venous catheter hand the nasal/ orogastric tube are stable and well positioned. IMPRESSION: 1. Since previous exam, the right-sided pleural effusion has mildly increased in size. 2. There has been no other significant change. There are persistent hazy airspace lung opacities as well as the pleural effusions. Lung opacities are most likely pulmonary edema. 3. Support apparatus is stable and well positioned. Electronically Signed   By: Amie Portlandavid  Ormond M.D.   On: 10/27/2016 16:09   Dg Chest Port 1 View  Result Date: 10/23/2016 CLINICAL DATA:  Pneumonia. EXAM: PORTABLE CHEST 1 VIEW COMPARISON:  10/17/2016. FINDINGS: Endotracheal tube, NG tube, right IJ line stable position. Cardiomegaly with bilateral diffuse pulmonary infiltrates and bilateral pleural effusions. Findings consistent with CHF. No pneumothorax. IMPRESSION: 1. Lines and tubes in stable position. 2. Cardiomegaly with bilateral pulmonary infiltrates/edema and bilateral pleural effusions suggesting CHF. Findings have progressed from prior exam. Electronically Signed   By: Maisie Fushomas  Register   On: 10/23/2016 07:29   Dg Chest Port 1 View  Result Date: 10/17/2016 CLINICAL DATA:  Myocardial infarct. Cardiomyopathy. Acute renal failure. Left pleural effusion status post thoracentesis. EXAM: PORTABLE CHEST 1 VIEW COMPARISON:  10/16/2016 FINDINGS: Support lines and tubes in appropriate position. Interval decrease in size of left pleural effusion is seen since prior study. No pneumothorax visualized. Moderate layering right pleural effusion shows no significant change. Cardiomegaly stable. Bibasilar atelectasis noted as well as perihilar airspace disease bilaterally, suspicious for pulmonary edema. IMPRESSION:  Decreased size of left pleural effusion. No pneumothorax visualized. Stable cardiomegaly and bilateral perihilar airspace disease/edema. Stable moderate layering right pleural effusion. Electronically Signed   By: Myles RosenthalJohn  Stahl M.D.   On: 10/17/2016 17:03   Dg Chest Port 1 View  Result Date: 10/16/2016 CLINICAL DATA:  Respiratory failure, intubated patient, bilateral pleural effusions. EXAM: PORTABLE CHEST 1 VIEW COMPARISON:  Portable chest x-ray of October 15, 2016. FINDINGS: The lungs are reasonably well inflated. Bibasilar and mid lung densities persist. There are bilateral pleural effusions layering posteriorly obscuring the costophrenic angles. There is no pleural effusion. The cardiac silhouette is largely obscured. The pulmonary vascularity is not clearly engorged. The endotracheal tube tip lies 3.7 cm above the carina. The dual-lumen right internal jugular venous catheter tip projects over the distal third of the SVC. The esophagogastric tube tip projects below the inferior margin of the image. IMPRESSION: Persistent moderate to large bilateral pleural effusions with bibasilar atelectasis or pneumonia. No definite pulmonary edema. The support tubes are in reasonable position. Electronically Signed   By: David  SwazilandJordan M.D.   On: 10/16/2016 07:47   Dg Chest Port 1 View  Result Date: 10/15/2016 CLINICAL DATA:  Encounter for ET tube placement/repositioning. EXAM: PORTABLE CHEST 1 VIEW COMPARISON:  10/15/2016 FINDINGS: The endotracheal tube has been repositioned, tip estimated to be 3.6 cm above the carina. Nasogastric tube is in place, tip beyond the film, below the lower esophagus. Right subclavian line tip overlies the level of the lower superior vena cava. Diffuse airspace filling opacities are noted bilaterally. Bilateral pleural effusions are present. IMPRESSION: Repositioned endotracheal tube. Bilateral lung opacities and pleural effusions. Electronically Signed   By: Norva PavlovElizabeth  Brown  M.D.   On:  10/15/2016 17:31   Dg Chest Port 1 View  Result Date: 10/15/2016 CLINICAL DATA:  Respiratory failure.  ETT tube placement. EXAM: PORTABLE CHEST 1 VIEW COMPARISON:  October 12, 2016 FINDINGS: The distal tip of the ET tube is 7 mm above the carina but does not extend down either mainstem bronchus. Recommend withdrawing 2 cm. A right central line is stable. No pneumothorax. Bilateral pleural effusions with underlying opacities, right greater than left. No change in the cardiomediastinal silhouette. IMPRESSION: 1. Low-lying ETT.  Recommend withdrawing 2 cm. 2. Bilateral pleural effusions, right greater than left, with underlying pulmonary opacities. These results will be called to the ordering clinician or representative by the Radiologist Assistant, and communication documented in the PACS or zVision Dashboard. Electronically Signed   By: Gerome Sam III M.D   On: 10/15/2016 15:46   Dg Chest Port 1 View  Result Date: 10/12/2016 CLINICAL DATA:  Respiratory failure EXAM: PORTABLE CHEST 1 VIEW COMPARISON:  10/04/2016 FINDINGS: Cardiac shadow is stable. Right jugular central line and nasogastric catheter are stable. Left jugular central line is been removed in the interval. Right-sided pleural effusion is again identified and stable. Increasing left-sided effusion is noted. Central vascular congestion is noted. IMPRESSION: Increasing left-sided pleural effusion. Stable right pleural effusion. Increased vascular congestion. Electronically Signed   By: Alcide Clever M.D.   On: 10/12/2016 08:08   Dg Abd Portable 1v  Result Date: 10/11/2016 CLINICAL DATA:  Nasogastric tube placement EXAM: PORTABLE ABDOMEN - 1 VIEW COMPARISON:  Portable exam 1734 hours compared to 0245 hours FINDINGS: Tip of nasogastric tube projects over stomach though uncertain position of the proximal side-port with respect to the GE junction, suspect above ; recommend advancing tube 5 cm. Nonobstructive bowel gas pattern. Bibasilar pleural  effusions and atelectasis noted. Numerous leads project over abdomen. IMPRESSION: Tip of nasogastric tube projects over stomach though the proximal side-port is likely above the gastroesophageal junction, recommend advancing tube 5 cm. Electronically Signed   By: Ulyses Southward M.D.   On: 10/11/2016 17:56   US Thoracentesis Asp Pleural Space W/img Guide  Result Date: 10/17/2016 INDICATION: Ventilator dependent respiratory failure with bilateral pleural effusions. Request is made for therapeutic thoracentesis on the side with the most fluid. EXAM: ULTRASOUND GUIDED THERAPEUTIC THORACENTESIS MEDICATIONS: 1% lidocaine. COMPLICATIONS: None immediate. PROCEDURE: An ultrasound guided thoracentesis was thoroughly discussed with the patient's wife and questions answered. The benefits, risks, alternatives and complications were also discussed. The patient's wife understands and wishes to proceed with the procedure. Verbal phone consent was obtained. Ultrasound was performed to localize and mark an adequate pocket of fluid in the left chest. The area was then prepped and draped in the normal sterile fashion. 1% Lidocaine was used for local anesthesia. Under ultrasound guidance a Safe-T-Centesis catheter was introduced. Thoracentesis was performed. The catheter was removed and a dressing applied. FINDINGS: A total of approximately 0.85 L of serosanguineous fluid was removed. IMPRESSION: Successful ultrasound guided left thoracentesis yielding 0.85 L of pleural fluid. Read by: Barnetta Chapel, PA-C Electronically Signed   By: Simonne Come M.D.   On: 10/17/2016 15:48    Labs:  CBC:  Recent Labs  11/06/16 1200 11/06/16 1858 11/08/16 1030 11/10/16 0500  WBC 22.7* 16.4* 15.9* 14.8*  HGB 7.0* 6.3* 7.5* 7.0*  HCT 22.6* 20.0* 24.2* 22.5*  PLT 154 153 204 186    COAGS:  Recent Labs  10/01/16 0600 10/23/16 0633  INR 1.91 1.65    BMP:  Recent  Labs  11/03/16 0610 11/05/16 0843 11/08/16 0500 11/10/16 0500    NA 130* 129* 134* 133*  K 4.0 4.5 3.9 4.0  CL 94* 95* 99* 97*  CO2 27 22 26 26   GLUCOSE 129* 102* 131* 157*  BUN 53* 48* 27* 49*  CALCIUM 7.8* 7.9* 8.3* 7.9*  CREATININE 2.91* 2.86* 2.16* 3.41*  GFRNONAA 19* 19* 27* 15*  GFRAA 22* 22* 31* 18*    LIVER FUNCTION TESTS:  Recent Labs  10/22/16 0830 10/23/16 0633 10/23/16 1400  10/28/16 0717  11/01/16 1624 11/03/16 0610 11/08/16 0500 11/10/16 0500  BILITOT 0.4 0.8 0.6  --  0.5  --   --   --   --   --   AST 211* 240* 193*  --  135*  --   --   --   --   --   ALT 113* 135* 113*  --  102*  --   --   --   --   --   ALKPHOS 157* 174* 139*  --  192*  --   --   --   --   --   PROT 6.4* 7.3 5.7*  --  7.6  --   --   --   --   --   ALBUMIN 1.5* 1.4* 1.6*  < > 1.7*  < > 1.8* 1.6* 2.2* 1.7*  < > = values in this interval not displayed.  TUMOR MARKERS: No results for input(s): AFPTM, CEA, CA199, CHROMGRNA in the last 8760 hours.  Assessment and Plan:  Dysphagia Necrotic bowel surgeries; slow healing Malnutrition  Dementia; need for long term care Scheduled for percutaneous gastric tube placement in IR 3/5 Need consent from family--NA x 2 Check labs 3/5 am  Thank you for this interesting consult.  I greatly enjoyed meeting MORTIMER BAIR and look forward to participating in their care.  A copy of this report was sent to the requesting provider on this date.  Electronically Signed: Ralene Muskrat A 11/10/2016, 4:19 PM   I spent a total of 40 Minutes    in face to face in clinical consultation, greater than 50% of which was counseling/coordinating care for perc G tube

## 2016-11-11 LAB — CULTURE, BLOOD (ROUTINE X 2): CULTURE: NO GROWTH

## 2016-11-12 ENCOUNTER — Other Ambulatory Visit (HOSPITAL_COMMUNITY): Payer: Medicare Other

## 2016-11-12 MED ORDER — IOPAMIDOL (ISOVUE-300) INJECTION 61%
INTRAVENOUS | Status: AC
  Filled 2016-11-12: qty 50

## 2016-11-13 LAB — RENAL FUNCTION PANEL
ANION GAP: 8 (ref 5–15)
Albumin: 1.9 g/dL — ABNORMAL LOW (ref 3.5–5.0)
BUN: 65 mg/dL — ABNORMAL HIGH (ref 6–20)
CALCIUM: 8 mg/dL — AB (ref 8.9–10.3)
CO2: 25 mmol/L (ref 22–32)
Chloride: 99 mmol/L — ABNORMAL LOW (ref 101–111)
Creatinine, Ser: 4.18 mg/dL — ABNORMAL HIGH (ref 0.61–1.24)
GFR calc non Af Amer: 12 mL/min — ABNORMAL LOW (ref >60)
GFR, EST AFRICAN AMERICAN: 14 mL/min — AB (ref >60)
Glucose, Bld: 109 mg/dL — ABNORMAL HIGH (ref 65–99)
PHOSPHORUS: 3.1 mg/dL (ref 2.5–4.6)
Potassium: 4.6 mmol/L (ref 3.5–5.1)
SODIUM: 132 mmol/L — AB (ref 135–145)

## 2016-11-13 LAB — APTT: APTT: 44 s — AB (ref 24–36)

## 2016-11-13 LAB — PROTIME-INR
INR: 1.79
PROTHROMBIN TIME: 21 s — AB (ref 11.4–15.2)

## 2016-11-13 LAB — CBC
HCT: 24.2 % — ABNORMAL LOW (ref 39.0–52.0)
HEMOGLOBIN: 7.4 g/dL — AB (ref 13.0–17.0)
MCH: 28.9 pg (ref 26.0–34.0)
MCHC: 30.6 g/dL (ref 30.0–36.0)
MCV: 94.5 fL (ref 78.0–100.0)
Platelets: 202 10*3/uL (ref 150–400)
RBC: 2.56 MIL/uL — AB (ref 4.22–5.81)
RDW: 20.1 % — ABNORMAL HIGH (ref 11.5–15.5)
WBC: 10.6 10*3/uL — ABNORMAL HIGH (ref 4.0–10.5)

## 2016-11-13 NOTE — Progress Notes (Signed)
[facility]  ROUNDING NOTE   Subjective:  Dialysis catheter has been removed. Patient had yeast growing on the blood noted on culture on 11/06/16. Therefore he did not have dialysis today.  Objective:  Vital signs in last 24 hours:  Temperature 97.0 pulse 72 respirations 23 blood pressure 106/67 Physical Exam: General: Critically ill-appearing  Head: NG in place  Eyes: Anicteric  Neck: traceostomy  Lungs:  Scattered rhonchi, on vent  Heart: S1S2 no rubs  Abdomen:  Soft,  colostomy in place  Extremities: 2+ dependent edema  Neurologic: Arousable, not following commands  Skin: Warm & dry  Access: none    Basic Metabolic Panel:  Recent Labs Lab 11/08/16 0500 11/10/16 0500 11/13/16 0614  NA 134* 133* 132*  K 3.9 4.0 4.6  CL 99* 97* 99*  CO2 26 26 25   GLUCOSE 131* 157* 109*  BUN 27* 49* 65*  CREATININE 2.16* 3.41* 4.18*  CALCIUM 8.3* 7.9* 8.0*  PHOS 1.9* 2.4* 3.1    Liver Function Tests:  Recent Labs Lab 11/08/16 0500 11/10/16 0500 11/13/16 0614  ALBUMIN 2.2* 1.7* 1.9*   No results for input(s): LIPASE, AMYLASE in the last 168 hours. No results for input(s): AMMONIA in the last 168 hours.  CBC:  Recent Labs Lab 11/06/16 1858 11/08/16 1030 11/10/16 0500 11/13/16 0614  WBC 16.4* 15.9* 14.8* 10.6*  NEUTROABS  --   --  12.0*  --   HGB 6.3* 7.5* 7.0* 7.4*  HCT 20.0* 24.2* 22.5* 24.2*  MCV 92.6 94.2 94.9 94.5  PLT 153 204 186 202    Cardiac Enzymes: No results for input(s): CKTOTAL, CKMB, CKMBINDEX, TROPONINI in the last 168 hours.  BNP: Invalid input(s): POCBNP  CBG: No results for input(s): GLUCAP in the last 168 hours.  Microbiology: Results for orders placed or performed during the hospital encounter of 09/30/16  Culture, blood (routine x 2)     Status: None   Collection Time: 10/06/16  4:19 PM  Result Value Ref Range Status   Specimen Description BLOOD HEMODIALYSIS CATHETER  Final   Special Requests BOTTLES DRAWN AEROBIC  ONLY 5CC  Final   Culture NO GROWTH 5 DAYS  Final   Report Status 10/11/2016 FINAL  Final  Culture, blood (routine x 2)     Status: None   Collection Time: 10/06/16  4:20 PM  Result Value Ref Range Status   Specimen Description BLOOD HEMODIALYSIS CATHETER  Final   Special Requests BOTTLES DRAWN AEROBIC ONLY 10CC  Final   Culture NO GROWTH 5 DAYS  Final   Report Status 10/11/2016 FINAL  Final  Culture, respiratory (NON-Expectorated)     Status: None   Collection Time: 10/27/16  2:03 PM  Result Value Ref Range Status   Specimen Description TRACHEAL ASPIRATE  Final   Special Requests NONE  Final   Gram Stain   Final    ABUNDANT WBC PRESENT, PREDOMINANTLY MONONUCLEAR ABUNDANT GRAM POSITIVE COCCI IN PAIRS ABUNDANT GRAM NEGATIVE COCCI IN PAIRS    Culture   Final    MODERATE METHICILLIN RESISTANT STAPHYLOCOCCUS AUREUS   Report Status 10/30/2016 FINAL  Final   Organism ID, Bacteria METHICILLIN RESISTANT STAPHYLOCOCCUS AUREUS  Final      Susceptibility   Methicillin resistant staphylococcus aureus - MIC*    CIPROFLOXACIN >=8 RESISTANT Resistant     ERYTHROMYCIN >=8 RESISTANT Resistant     GENTAMICIN <=0.5 SENSITIVE Sensitive     OXACILLIN >=4 RESISTANT Resistant     TETRACYCLINE <=1 SENSITIVE Sensitive  VANCOMYCIN 1 SENSITIVE Sensitive     TRIMETH/SULFA <=10 SENSITIVE Sensitive     CLINDAMYCIN <=0.25 SENSITIVE Sensitive     RIFAMPIN <=0.5 SENSITIVE Sensitive     Inducible Clindamycin NEGATIVE Sensitive     * MODERATE METHICILLIN RESISTANT STAPHYLOCOCCUS AUREUS  Culture, blood (routine x 2)     Status: None   Collection Time: 11/06/16  6:50 PM  Result Value Ref Range Status   Specimen Description BLOOD RIGHT HAND  Final   Special Requests IN PEDIATRIC BOTTLE 2CC  Final   Culture NO GROWTH 5 DAYS  Final   Report Status 11/11/2016 FINAL  Final  Culture, blood (routine x 2)     Status: Abnormal   Collection Time: 11/06/16  7:00 PM  Result Value Ref Range Status   Specimen  Description BLOOD LEFT HAND  Final   Special Requests BOTTLES DRAWN AEROBIC AND ANAEROBIC 5CC  Final   Culture  Setup Time   Final    BUDDING YEAST SEEN AEROBIC BOTTLE ONLY CRITICAL RESULT CALLED TO, READ BACK BY AND VERIFIED WITH: N SMITH,RN AT 1707 11/09/16 BY L BENFIELD    Culture CANDIDA SPECIES, NOT ALBICANS (A)  Final   Report Status 11/11/2016 FINAL  Final  Blood Culture ID Panel (Reflexed)     Status: None   Collection Time: 11/06/16  7:00 PM  Result Value Ref Range Status   Enterococcus species NOT DETECTED NOT DETECTED Final   Listeria monocytogenes NOT DETECTED NOT DETECTED Final   Staphylococcus species NOT DETECTED NOT DETECTED Final   Staphylococcus aureus NOT DETECTED NOT DETECTED Final   Streptococcus species NOT DETECTED NOT DETECTED Final   Streptococcus agalactiae NOT DETECTED NOT DETECTED Final   Streptococcus pneumoniae NOT DETECTED NOT DETECTED Final   Streptococcus pyogenes NOT DETECTED NOT DETECTED Final   Acinetobacter baumannii NOT DETECTED NOT DETECTED Final   Enterobacteriaceae species NOT DETECTED NOT DETECTED Final   Enterobacter cloacae complex NOT DETECTED NOT DETECTED Final   Escherichia coli NOT DETECTED NOT DETECTED Final   Klebsiella oxytoca NOT DETECTED NOT DETECTED Final   Klebsiella pneumoniae NOT DETECTED NOT DETECTED Final   Proteus species NOT DETECTED NOT DETECTED Final   Serratia marcescens NOT DETECTED NOT DETECTED Final   Haemophilus influenzae NOT DETECTED NOT DETECTED Final   Neisseria meningitidis NOT DETECTED NOT DETECTED Final   Pseudomonas aeruginosa NOT DETECTED NOT DETECTED Final   Candida albicans NOT DETECTED NOT DETECTED Final   Candida glabrata NOT DETECTED NOT DETECTED Final   Candida krusei NOT DETECTED NOT DETECTED Final   Candida parapsilosis NOT DETECTED NOT DETECTED Final   Candida tropicalis NOT DETECTED NOT DETECTED Final    Coagulation Studies:  Recent Labs  11/13/16 0614  LABPROT 21.0*  INR 1.79     Urinalysis: No results for input(s): COLORURINE, LABSPEC, PHURINE, GLUCOSEU, HGBUR, BILIRUBINUR, KETONESUR, PROTEINUR, UROBILINOGEN, NITRITE, LEUKOCYTESUR in the last 72 hours.  Invalid input(s): APPERANCEUR    Imaging: Dg Abd Portable 1v  Result Date: 11/12/2016 CLINICAL DATA:  Evaluate NG tube placement. EXAM: PORTABLE ABDOMEN - 1 VIEW COMPARISON:  October 23, 2016 FINDINGS: The NG tube distal tip is near the antrum. Pleural effusions are identified. Lucency projected over the low inferior medial right lower chest is nonspecific and incompletely evaluated on this study. The cholecystostomy tube is again identified. No other acute abnormalities. IMPRESSION: 1.  The NG tube is in good position. 2 the lucency over the medial right chest and right lower chest is nonspecific. This could be artifactual  or represent unusual aerated lung. A PA and lateral chest x-ray could better evaluate. This does not have the typical appearance of free air. Electronically Signed   By: Gerome Sam III M.D   On: 11/12/2016 18:06     Medications:       Assessment/ Plan:  83 y.o.  African American male with a PMHx of dementia, chronic kidney disease stage IV, congestive heart failure ejection fraction 25-30%, who was admitted to [facility] on 09/30/2016 for ongoing management of cholecystitis and volvulus status post exploratory laparotomy with colectomy. Patient was previously in [place]. Dialysis started at [facility] around 10/03/2016  1.  ESRD. ATN no recovery  2.  Anasarca   3.  Acute respiratory failure., vent dependent 4.  Metabolic acidosis. 5.  Anemia of CKD.  6.  Hyponatremia - volume overload  7.  Fungemia.     Plan:  Patient remains critically ill appearing. He now has fungemia. emporary dialysis catheter has been removed.  Recommend keeping patient catheter free for several days. Antibiotic management as per hospitalist or infectious disease. Overall patient continues to have  a very guarded prognosis givenESRD, respiratory failure, and now fungemia.   LOS: 0 Monika Chestang 3/5/20184:41 PM

## 2016-11-13 NOTE — Progress Notes (Signed)
Case discussed with Dr. Sharyon MedicusHijazi.  Temporary dialysis catheter was removed on Friday. If the patient has been cancer free for at least 3 days. We will plan fortemporary catheter to be replaced tomorrow and we will start him back on dialysis tomorrow.

## 2016-11-14 ENCOUNTER — Other Ambulatory Visit (HOSPITAL_COMMUNITY): Payer: Medicare Other

## 2016-11-14 HISTORY — PX: IR GENERIC HISTORICAL: IMG1180011

## 2016-11-14 LAB — CBC WITH DIFFERENTIAL/PLATELET
BASOS ABS: 0 10*3/uL (ref 0.0–0.1)
BASOS PCT: 0 %
EOS PCT: 2 %
Eosinophils Absolute: 0.2 10*3/uL (ref 0.0–0.7)
HEMATOCRIT: 24.4 % — AB (ref 39.0–52.0)
Hemoglobin: 7.5 g/dL — ABNORMAL LOW (ref 13.0–17.0)
Lymphocytes Relative: 13 %
Lymphs Abs: 1.7 10*3/uL (ref 0.7–4.0)
MCH: 29 pg (ref 26.0–34.0)
MCHC: 30.7 g/dL (ref 30.0–36.0)
MCV: 94.2 fL (ref 78.0–100.0)
MONO ABS: 1 10*3/uL (ref 0.1–1.0)
MONOS PCT: 8 %
Neutro Abs: 10.1 10*3/uL — ABNORMAL HIGH (ref 1.7–7.7)
Neutrophils Relative %: 77 %
PLATELETS: 202 10*3/uL (ref 150–400)
RBC: 2.59 MIL/uL — ABNORMAL LOW (ref 4.22–5.81)
RDW: 19.8 % — AB (ref 11.5–15.5)
WBC: 13 10*3/uL — ABNORMAL HIGH (ref 4.0–10.5)

## 2016-11-14 LAB — BASIC METABOLIC PANEL
Anion gap: 12 (ref 5–15)
BUN: 76 mg/dL — AB (ref 6–20)
CHLORIDE: 98 mmol/L — AB (ref 101–111)
CO2: 24 mmol/L (ref 22–32)
CREATININE: 4.55 mg/dL — AB (ref 0.61–1.24)
Calcium: 8.5 mg/dL — ABNORMAL LOW (ref 8.9–10.3)
GFR calc Af Amer: 13 mL/min — ABNORMAL LOW (ref >60)
GFR calc non Af Amer: 11 mL/min — ABNORMAL LOW (ref >60)
GLUCOSE: 112 mg/dL — AB (ref 65–99)
Potassium: 5.2 mmol/L — ABNORMAL HIGH (ref 3.5–5.1)
SODIUM: 134 mmol/L — AB (ref 135–145)

## 2016-11-14 LAB — PROTIME-INR
INR: 1.78
Prothrombin Time: 21 seconds — ABNORMAL HIGH (ref 11.4–15.2)

## 2016-11-14 MED ORDER — HEPARIN SODIUM (PORCINE) 1000 UNIT/ML IJ SOLN
INTRAMUSCULAR | Status: AC
  Filled 2016-11-14: qty 1

## 2016-11-14 MED ORDER — LIDOCAINE HCL (PF) 1 % IJ SOLN
INTRAMUSCULAR | Status: AC
  Filled 2016-11-14: qty 30

## 2016-11-14 NOTE — Procedures (Signed)
  Pre-operative Diagnosis: ESRD        Post-operative Diagnosis: ESRD   Indications: Needs new dialysis access, elevated INR level  Procedure: Placement of non-tunneled dialysis catheter   Findings: Occluded right IJ. Patent right EJ.  Patent left IJ (and plan to leave for future [facility]).  Rt EJ catheter placed and tip at SVC/RA junction  Complications: None     EBL: Minimal  Plan: HD catheter is ready to use.

## 2016-11-15 ENCOUNTER — Encounter (HOSPITAL_COMMUNITY): Payer: Self-pay | Admitting: Diagnostic Radiology

## 2016-11-15 ENCOUNTER — Other Ambulatory Visit (HOSPITAL_BASED_OUTPATIENT_CLINIC_OR_DEPARTMENT_OTHER): Payer: Medicare Other

## 2016-11-15 DIAGNOSIS — I339 Acute and subacute endocarditis, unspecified: Secondary | ICD-10-CM | POA: Diagnosis not present

## 2016-11-15 LAB — BASIC METABOLIC PANEL
Anion gap: 10 (ref 5–15)
BUN: 85 mg/dL — AB (ref 6–20)
CHLORIDE: 99 mmol/L — AB (ref 101–111)
CO2: 23 mmol/L (ref 22–32)
Calcium: 8 mg/dL — ABNORMAL LOW (ref 8.9–10.3)
Creatinine, Ser: 5.04 mg/dL — ABNORMAL HIGH (ref 0.61–1.24)
GFR calc Af Amer: 11 mL/min — ABNORMAL LOW (ref >60)
GFR calc non Af Amer: 10 mL/min — ABNORMAL LOW (ref >60)
GLUCOSE: 114 mg/dL — AB (ref 65–99)
POTASSIUM: 4.7 mmol/L (ref 3.5–5.1)
SODIUM: 132 mmol/L — AB (ref 135–145)

## 2016-11-15 LAB — CBC
HEMATOCRIT: 22.3 % — AB (ref 39.0–52.0)
HEMOGLOBIN: 7 g/dL — AB (ref 13.0–17.0)
MCH: 29 pg (ref 26.0–34.0)
MCHC: 30.9 g/dL (ref 30.0–36.0)
MCV: 93.7 fL (ref 78.0–100.0)
Platelets: 219 10*3/uL (ref 150–400)
RBC: 2.38 MIL/uL — AB (ref 4.22–5.81)
RDW: 19.6 % — ABNORMAL HIGH (ref 11.5–15.5)
WBC: 12.5 10*3/uL — ABNORMAL HIGH (ref 4.0–10.5)

## 2016-11-15 LAB — BPAM FFP
BLOOD PRODUCT EXPIRATION DATE: 201803072359
Blood Product Expiration Date: 201803072359
ISSUE DATE / TIME: 201803060048
ISSUE DATE / TIME: 201803060203
Unit Type and Rh: 6200
Unit Type and Rh: 6200

## 2016-11-15 LAB — PREPARE FRESH FROZEN PLASMA
Unit division: 0
Unit division: 0

## 2016-11-15 LAB — ECHOCARDIOGRAM LIMITED
CHL CUP TV REG PEAK VELOCITY: 290 cm/s
FS: 36 % (ref 28–44)
IVS/LV PW RATIO, ED: 0.93
LA ID, A-P, ES: 39 mm
LA diam end sys: 39 mm
LA diam index: 2.57 cm/m2
LA vol A4C: 44.3 ml
LA vol: 51.6 mL
LAVOLIN: 34.1 mL/m2
LV PW d: 7.22 mm — AB (ref 0.6–1.1)
LVOT area: 3.46 cm2
LVOT diameter: 21 mm
TRMAXVEL: 290 cm/s

## 2016-11-15 LAB — PROTIME-INR
INR: 1.84
PROTHROMBIN TIME: 21.5 s — AB (ref 11.4–15.2)

## 2016-11-15 NOTE — Progress Notes (Signed)
  Echocardiogram 2D Echocardiogram has been performed.  Jesus SavoyCasey N Mica Vega 11/15/2016, 2:46 PM

## 2016-11-15 NOTE — Progress Notes (Signed)
[facility]  ROUNDING NOTE   Subjective:  Patient did have a right EJ dialysis catheter placed yesterday. Right internal jugular vein was occluded. He did not receive dialysis yesterday secondary to timing of catheter placement. We will plan for dialysis again today.  Objective:  Vital signs in last 24 hours:  Temperature 96.7 pulse 102 respirations 26 blood pressure 122/84  Physical Exam: General: Critically ill-appearing  Head: NG in place  Eyes: Anicteric  Neck: traceostomy  Lungs:  Scattered rhonchi, on vent  Heart: S1S2 no rubs  Abdomen:  Soft,  colostomy in place  Extremities: 2+ dependent edema  Neurologic: Arousable, not following commands  Skin: Warm & dry  Access: Right external jugular temporary dialysis catheter    Basic Metabolic Panel:  Recent Labs Lab 11/10/16 0500 11/13/16 0614 11/14/16 0754  NA 133* 132* 134*  K 4.0 4.6 5.2*  CL 97* 99* 98*  CO2 26 25 24   GLUCOSE 157* 109* 112*  BUN 49* 65* 76*  CREATININE 3.41* 4.18* 4.55*  CALCIUM 7.9* 8.0* 8.5*  PHOS 2.4* 3.1  --     Liver Function Tests:  Recent Labs Lab 11/10/16 0500 11/13/16 0614  ALBUMIN 1.7* 1.9*   No results for input(s): LIPASE, AMYLASE in the last 168 hours. No results for input(s): AMMONIA in the last 168 hours.  CBC:  Recent Labs Lab 11/10/16 0500 11/13/16 0614 11/14/16 0754  WBC 14.8* 10.6* 13.0*  NEUTROABS 12.0*  --  10.1*  HGB 7.0* 7.4* 7.5*  HCT 22.5* 24.2* 24.4*  MCV 94.9 94.5 94.2  PLT 186 202 202    Cardiac Enzymes: No results for input(s): CKTOTAL, CKMB, CKMBINDEX, TROPONINI in the last 168 hours.  BNP: Invalid input(s): POCBNP  CBG: No results for input(s): GLUCAP in the last 168 hours.  Microbiology: Results for orders placed or performed during the hospital encounter of 09/30/16  Culture, blood (routine x 2)     Status: None   Collection Time: 10/06/16  4:19 PM  Result Value Ref Range Status   Specimen Description BLOOD  HEMODIALYSIS CATHETER  Final   Special Requests BOTTLES DRAWN AEROBIC ONLY 5CC  Final   Culture NO GROWTH 5 DAYS  Final   Report Status 10/11/2016 FINAL  Final  Culture, blood (routine x 2)     Status: None   Collection Time: 10/06/16  4:20 PM  Result Value Ref Range Status   Specimen Description BLOOD HEMODIALYSIS CATHETER  Final   Special Requests BOTTLES DRAWN AEROBIC ONLY 10CC  Final   Culture NO GROWTH 5 DAYS  Final   Report Status 10/11/2016 FINAL  Final  Culture, respiratory (NON-Expectorated)     Status: None   Collection Time: 10/27/16  2:03 PM  Result Value Ref Range Status   Specimen Description TRACHEAL ASPIRATE  Final   Special Requests NONE  Final   Gram Stain   Final    ABUNDANT WBC PRESENT, PREDOMINANTLY MONONUCLEAR ABUNDANT GRAM POSITIVE COCCI IN PAIRS ABUNDANT GRAM NEGATIVE COCCI IN PAIRS    Culture   Final    MODERATE METHICILLIN RESISTANT STAPHYLOCOCCUS AUREUS   Report Status 10/30/2016 FINAL  Final   Organism ID, Bacteria METHICILLIN RESISTANT STAPHYLOCOCCUS AUREUS  Final      Susceptibility   Methicillin resistant staphylococcus aureus - MIC*    CIPROFLOXACIN >=8 RESISTANT Resistant     ERYTHROMYCIN >=8 RESISTANT Resistant     GENTAMICIN <=0.5 SENSITIVE Sensitive     OXACILLIN >=4 RESISTANT Resistant     TETRACYCLINE <=1  SENSITIVE Sensitive     VANCOMYCIN 1 SENSITIVE Sensitive     TRIMETH/SULFA <=10 SENSITIVE Sensitive     CLINDAMYCIN <=0.25 SENSITIVE Sensitive     RIFAMPIN <=0.5 SENSITIVE Sensitive     Inducible Clindamycin NEGATIVE Sensitive     * MODERATE METHICILLIN RESISTANT STAPHYLOCOCCUS AUREUS  Culture, blood (routine x 2)     Status: None   Collection Time: 11/06/16  6:50 PM  Result Value Ref Range Status   Specimen Description BLOOD RIGHT HAND  Final   Special Requests IN PEDIATRIC BOTTLE 2CC  Final   Culture NO GROWTH 5 DAYS  Final   Report Status 11/11/2016 FINAL  Final  Culture, blood (routine x 2)     Status: Abnormal   Collection  Time: 11/06/16  7:00 PM  Result Value Ref Range Status   Specimen Description BLOOD LEFT HAND  Final   Special Requests BOTTLES DRAWN AEROBIC AND ANAEROBIC 5CC  Final   Culture  Setup Time   Final    BUDDING YEAST SEEN AEROBIC BOTTLE ONLY CRITICAL RESULT CALLED TO, READ BACK BY AND VERIFIED WITH: N SMITH,RN AT 1707 11/09/16 BY L BENFIELD    Culture CANDIDA SPECIES, NOT ALBICANS (A)  Final   Report Status 11/11/2016 FINAL  Final  Blood Culture ID Panel (Reflexed)     Status: None   Collection Time: 11/06/16  7:00 PM  Result Value Ref Range Status   Enterococcus species NOT DETECTED NOT DETECTED Final   Listeria monocytogenes NOT DETECTED NOT DETECTED Final   Staphylococcus species NOT DETECTED NOT DETECTED Final   Staphylococcus aureus NOT DETECTED NOT DETECTED Final   Streptococcus species NOT DETECTED NOT DETECTED Final   Streptococcus agalactiae NOT DETECTED NOT DETECTED Final   Streptococcus pneumoniae NOT DETECTED NOT DETECTED Final   Streptococcus pyogenes NOT DETECTED NOT DETECTED Final   Acinetobacter baumannii NOT DETECTED NOT DETECTED Final   Enterobacteriaceae species NOT DETECTED NOT DETECTED Final   Enterobacter cloacae complex NOT DETECTED NOT DETECTED Final   Escherichia coli NOT DETECTED NOT DETECTED Final   Klebsiella oxytoca NOT DETECTED NOT DETECTED Final   Klebsiella pneumoniae NOT DETECTED NOT DETECTED Final   Proteus species NOT DETECTED NOT DETECTED Final   Serratia marcescens NOT DETECTED NOT DETECTED Final   Haemophilus influenzae NOT DETECTED NOT DETECTED Final   Neisseria meningitidis NOT DETECTED NOT DETECTED Final   Pseudomonas aeruginosa NOT DETECTED NOT DETECTED Final   Candida albicans NOT DETECTED NOT DETECTED Final   Candida glabrata NOT DETECTED NOT DETECTED Final   Candida krusei NOT DETECTED NOT DETECTED Final   Candida parapsilosis NOT DETECTED NOT DETECTED Final   Candida tropicalis NOT DETECTED NOT DETECTED Final    Coagulation  Studies:  Recent Labs  11/13/16 0614 11/14/16 0754 11/15/16 0652  LABPROT 21.0* 21.0* 21.5*  INR 1.79 1.78 1.84    Urinalysis: No results for input(s): COLORURINE, LABSPEC, PHURINE, GLUCOSEU, HGBUR, BILIRUBINUR, KETONESUR, PROTEINUR, UROBILINOGEN, NITRITE, LEUKOCYTESUR in the last 72 hours.  Invalid input(s): APPERANCEUR    Imaging: Ir Fluoro Guide Cv Line Right  Result Date: 11/15/2016 INDICATION: 82 year old with end-stage renal disease. Patient needs new dialysis access but has elevated INR level. Plan for placement of a temporary non tunneled catheter. EXAM: FLUOROSCOPIC AND ULTRASOUND GUIDED PLACEMENT OF A NON-TUNNELED DIALYSIS CATHETER Physician: Rachelle Hora. Henn, MD MEDICATIONS: None ANESTHESIA/SEDATION: None FLUOROSCOPY TIME:  Fluoroscopy Time: 24 seconds, 0.8 mGy COMPLICATIONS: None immediate. PROCEDURE: The procedure was explained to the patient's family. The risks and benefits of the  procedure were discussed and the family's questions were addressed. Informed consent was obtained from the patient's wide. The patient was placed supine on the interventional table. Ultrasound demonstrated thrombus in the right internal jugular vein. The right external jugular vein was patent. The left internal jugular vein was patent. Decided to use the right external jugular vein for temporary access and leave the left internal jugular vein for possible tunneled catheter in the future. Ultrasound images were obtained for documentation. The right side of the neck was prepped and draped in a sterile fashion. The right side of the neck was anesthetized with 1% lidocaine. Maximal barrier sterile technique was utilized including caps, mask, sterile gowns, sterile gloves, sterile drape, hand hygiene and skin antiseptic. A small incision was made with #11 blade scalpel. A 21 gauge needle directed into the right external jugular vein with ultrasound guidance. A micropuncture dilator set was placed. A 20 cm Mahurkar  catheter was selected. The catheter was advanced over a wire and positioned at the superior cavoatrial junction. Fluoroscopic images were obtained for documentation. Both dialysis lumens were found to aspirate and flush well. The proper amount of heparin was flushed in both lumens. The central venous lumen was flushed with normal saline. Catheter was sutured to skin. FINDINGS: Catheter tip at the superior cavoatrial junction. Thrombus in the right internal jugular vein. The right external jugular vein and the left internal jugular vein are patent. IMPRESSION: Successful placement of a right external jugular non-tunneled dialysis catheter using ultrasound and fluoroscopic guidance. Electronically Signed   By: Richarda Overlie M.D.   On: 11/15/2016 09:00   Ir US Guide Vasc Access Right  Result Date: 11/15/2016 INDICATION: 83 year old with end-stage renal disease. Patient needs new dialysis access but has elevated INR level. Plan for placement of a temporary non tunneled catheter. EXAM: FLUOROSCOPIC AND ULTRASOUND GUIDED PLACEMENT OF A NON-TUNNELED DIALYSIS CATHETER Physician: Rachelle Hora. Henn, MD MEDICATIONS: None ANESTHESIA/SEDATION: None FLUOROSCOPY TIME:  Fluoroscopy Time: 24 seconds, 0.8 mGy COMPLICATIONS: None immediate. PROCEDURE: The procedure was explained to the patient's family. The risks and benefits of the procedure were discussed and the family's questions were addressed. Informed consent was obtained from the patient's wide. The patient was placed supine on the interventional table. Ultrasound demonstrated thrombus in the right internal jugular vein. The right external jugular vein was patent. The left internal jugular vein was patent. Decided to use the right external jugular vein for temporary access and leave the left internal jugular vein for possible tunneled catheter in the future. Ultrasound images were obtained for documentation. The right side of the neck was prepped and draped in a sterile fashion.  The right side of the neck was anesthetized with 1% lidocaine. Maximal barrier sterile technique was utilized including caps, mask, sterile gowns, sterile gloves, sterile drape, hand hygiene and skin antiseptic. A small incision was made with #11 blade scalpel. A 21 gauge needle directed into the right external jugular vein with ultrasound guidance. A micropuncture dilator set was placed. A 20 cm Mahurkar catheter was selected. The catheter was advanced over a wire and positioned at the superior cavoatrial junction. Fluoroscopic images were obtained for documentation. Both dialysis lumens were found to aspirate and flush well. The proper amount of heparin was flushed in both lumens. The central venous lumen was flushed with normal saline. Catheter was sutured to skin. FINDINGS: Catheter tip at the superior cavoatrial junction. Thrombus in the right internal jugular vein. The right external jugular vein and the left internal jugular vein  are patent. IMPRESSION: Successful placement of a right external jugular non-tunneled dialysis catheter using ultrasound and fluoroscopic guidance. Electronically Signed   By: Richarda OverlieAdam  Henn M.D.   On: 11/15/2016 09:00     Medications:       Assessment/ Plan:  83 y.o.  African American male with a PMHx of dementia, chronic kidney disease stage IV, congestive heart failure ejection fraction 25-30%, who was admitted to [facility] on 09/30/2016 for ongoing management of cholecystitis and volvulus status post exploratory laparotomy with colectomy. [place] [facility]  1.  ESRD. ATN no recovery  2.  Anasarca   3.  Acute respiratory failure., vent dependent 4.  Metabolic acidosis. 5.  Anemia of CKD.  6.  Hyponatremia - volume overload  7.  Fungemia, R EJ catheter replaced 11/14/16     Plan:  Patient was kept dialysis catheter free for several days. Right external jugular temporary dialysis catheter  was replaced yesterday.  Dialysis was not performed yesterday given timing of temporary dialysis catheter placement. Therefore we will plan for dialysis today and we will continue the patient on a Monday, Wednesday, Friday dialysis schedule.  Overall the patient remains critically ill. He continues to have a very guarded prognosis.  LOS: 0 Jesus Vega 3/7/20183:18 PM

## 2016-11-16 LAB — PROTIME-INR
INR: 1.72
Prothrombin Time: 20.4 seconds — ABNORMAL HIGH (ref 11.4–15.2)

## 2016-11-16 LAB — CBC
HEMATOCRIT: 25.8 % — AB (ref 39.0–52.0)
HEMOGLOBIN: 8 g/dL — AB (ref 13.0–17.0)
MCH: 29.5 pg (ref 26.0–34.0)
MCHC: 31 g/dL (ref 30.0–36.0)
MCV: 95.2 fL (ref 78.0–100.0)
Platelets: 210 10*3/uL (ref 150–400)
RBC: 2.71 MIL/uL — ABNORMAL LOW (ref 4.22–5.81)
RDW: 20.3 % — ABNORMAL HIGH (ref 11.5–15.5)
WBC: 10.6 10*3/uL — ABNORMAL HIGH (ref 4.0–10.5)

## 2016-11-17 LAB — RENAL FUNCTION PANEL
ALBUMIN: 1.8 g/dL — AB (ref 3.5–5.0)
Anion gap: 9 (ref 5–15)
BUN: 47 mg/dL — ABNORMAL HIGH (ref 6–20)
CALCIUM: 8 mg/dL — AB (ref 8.9–10.3)
CO2: 27 mmol/L (ref 22–32)
CREATININE: 3.48 mg/dL — AB (ref 0.61–1.24)
Chloride: 97 mmol/L — ABNORMAL LOW (ref 101–111)
GFR calc Af Amer: 17 mL/min — ABNORMAL LOW (ref >60)
GFR, EST NON AFRICAN AMERICAN: 15 mL/min — AB (ref >60)
Glucose, Bld: 110 mg/dL — ABNORMAL HIGH (ref 65–99)
PHOSPHORUS: 2.9 mg/dL (ref 2.5–4.6)
POTASSIUM: 3.8 mmol/L (ref 3.5–5.1)
SODIUM: 133 mmol/L — AB (ref 135–145)

## 2016-11-17 LAB — PREPARE RBC (CROSSMATCH)

## 2016-11-17 LAB — PROTIME-INR
INR: 1.75
Prothrombin Time: 20.7 seconds — ABNORMAL HIGH (ref 11.4–15.2)

## 2016-11-17 LAB — HEMOGLOBIN AND HEMATOCRIT, BLOOD
HCT: 25.5 % — ABNORMAL LOW (ref 39.0–52.0)
Hemoglobin: 7.7 g/dL — ABNORMAL LOW (ref 13.0–17.0)

## 2016-11-17 NOTE — Progress Notes (Signed)
[facility]  ROUNDING NOTE   Subjective:  Patient remains critically ill at this point in time. He will be due for hemodialysis later today. Overall remains critically ill.  Objective:  Vital signs in last 24 hours:  Temperature 97.4 pulse 75 respirations 21 blood pressure 95/53  Physical Exam: General: Critically ill-appearing  Head: NG in place  Eyes: Anicteric  Neck: traceostomy  Lungs:  Scattered rhonchi, on vent  Heart: S1S2 no rubs  Abdomen:  Soft,  colostomy in place  Extremities: 2+ dependent edema  Neurologic: Arousable, not following commands  Skin: Warm & dry  Access: Right external jugular temporary dialysis catheter    Basic Metabolic Panel:  Recent Labs Lab 11/13/16 0614 11/14/16 0754 11/15/16 1530 11/17/16 0528  NA 132* 134* 132* 133*  K 4.6 5.2* 4.7 3.8  CL 99* 98* 99* 97*  CO2 25 24 23 27   GLUCOSE 109* 112* 114* 110*  BUN 65* 76* 85* 47*  CREATININE 4.18* 4.55* 5.04* 3.48*  CALCIUM 8.0* 8.5* 8.0* 8.0*  PHOS 3.1  --   --  2.9    Liver Function Tests:  Recent Labs Lab 11/13/16 0614 11/17/16 0528  ALBUMIN 1.9* 1.8*   No results for input(s): LIPASE, AMYLASE in the last 168 hours. No results for input(s): AMMONIA in the last 168 hours.  CBC:  Recent Labs Lab 11/13/16 0614 11/14/16 0754 11/15/16 1530 11/16/16 0806 11/17/16 0527  WBC 10.6* 13.0* 12.5* 10.6* 11.4*  NEUTROABS  --  10.1*  --   --   --   HGB 7.4* 7.5* 7.0* 8.0* 6.5*  HCT 24.2* 24.4* 22.3* 25.8* 21.2*  MCV 94.5 94.2 93.7 95.2 94.2  PLT 202 202 219 210 200    Cardiac Enzymes: No results for input(s): CKTOTAL, CKMB, CKMBINDEX, TROPONINI in the last 168 hours.  BNP: Invalid input(s): POCBNP  CBG: No results for input(s): GLUCAP in the last 168 hours.  Microbiology: Results for orders placed or performed during the hospital encounter of 09/30/16  Culture, blood (routine x 2)     Status: None   Collection Time: 10/06/16  4:19 PM  Result Value Ref  Range Status   Specimen Description BLOOD HEMODIALYSIS CATHETER  Final   Special Requests BOTTLES DRAWN AEROBIC ONLY 5CC  Final   Culture NO GROWTH 5 DAYS  Final   Report Status 10/11/2016 FINAL  Final  Culture, blood (routine x 2)     Status: None   Collection Time: 10/06/16  4:20 PM  Result Value Ref Range Status   Specimen Description BLOOD HEMODIALYSIS CATHETER  Final   Special Requests BOTTLES DRAWN AEROBIC ONLY 10CC  Final   Culture NO GROWTH 5 DAYS  Final   Report Status 10/11/2016 FINAL  Final  Culture, respiratory (NON-Expectorated)     Status: None   Collection Time: 10/27/16  2:03 PM  Result Value Ref Range Status   Specimen Description TRACHEAL ASPIRATE  Final   Special Requests NONE  Final   Gram Stain   Final    ABUNDANT WBC PRESENT, PREDOMINANTLY MONONUCLEAR ABUNDANT GRAM POSITIVE COCCI IN PAIRS ABUNDANT GRAM NEGATIVE COCCI IN PAIRS    Culture   Final    MODERATE METHICILLIN RESISTANT STAPHYLOCOCCUS AUREUS   Report Status 10/30/2016 FINAL  Final   Organism ID, Bacteria METHICILLIN RESISTANT STAPHYLOCOCCUS AUREUS  Final      Susceptibility   Methicillin resistant staphylococcus aureus - MIC*    CIPROFLOXACIN >=8 RESISTANT Resistant     ERYTHROMYCIN >=8 RESISTANT Resistant  GENTAMICIN <=0.5 SENSITIVE Sensitive     OXACILLIN >=4 RESISTANT Resistant     TETRACYCLINE <=1 SENSITIVE Sensitive     VANCOMYCIN 1 SENSITIVE Sensitive     TRIMETH/SULFA <=10 SENSITIVE Sensitive     CLINDAMYCIN <=0.25 SENSITIVE Sensitive     RIFAMPIN <=0.5 SENSITIVE Sensitive     Inducible Clindamycin NEGATIVE Sensitive     * MODERATE METHICILLIN RESISTANT STAPHYLOCOCCUS AUREUS  Culture, blood (routine x 2)     Status: None   Collection Time: 11/06/16  6:50 PM  Result Value Ref Range Status   Specimen Description BLOOD RIGHT HAND  Final   Special Requests IN PEDIATRIC BOTTLE 2CC  Final   Culture NO GROWTH 5 DAYS  Final   Report Status 11/11/2016 FINAL  Final  Culture, blood (routine x  2)     Status: Abnormal   Collection Time: 11/06/16  7:00 PM  Result Value Ref Range Status   Specimen Description BLOOD LEFT HAND  Final   Special Requests BOTTLES DRAWN AEROBIC AND ANAEROBIC 5CC  Final   Culture  Setup Time   Final    BUDDING YEAST SEEN AEROBIC BOTTLE ONLY CRITICAL RESULT CALLED TO, READ BACK BY AND VERIFIED WITH: N SMITH,RN AT 1707 11/09/16 BY L BENFIELD    Culture CANDIDA SPECIES, NOT ALBICANS (A)  Final   Report Status 11/11/2016 FINAL  Final  Blood Culture ID Panel (Reflexed)     Status: None   Collection Time: 11/06/16  7:00 PM  Result Value Ref Range Status   Enterococcus species NOT DETECTED NOT DETECTED Final   Listeria monocytogenes NOT DETECTED NOT DETECTED Final   Staphylococcus species NOT DETECTED NOT DETECTED Final   Staphylococcus aureus NOT DETECTED NOT DETECTED Final   Streptococcus species NOT DETECTED NOT DETECTED Final   Streptococcus agalactiae NOT DETECTED NOT DETECTED Final   Streptococcus pneumoniae NOT DETECTED NOT DETECTED Final   Streptococcus pyogenes NOT DETECTED NOT DETECTED Final   Acinetobacter baumannii NOT DETECTED NOT DETECTED Final   Enterobacteriaceae species NOT DETECTED NOT DETECTED Final   Enterobacter cloacae complex NOT DETECTED NOT DETECTED Final   Escherichia coli NOT DETECTED NOT DETECTED Final   Klebsiella oxytoca NOT DETECTED NOT DETECTED Final   Klebsiella pneumoniae NOT DETECTED NOT DETECTED Final   Proteus species NOT DETECTED NOT DETECTED Final   Serratia marcescens NOT DETECTED NOT DETECTED Final   Haemophilus influenzae NOT DETECTED NOT DETECTED Final   Neisseria meningitidis NOT DETECTED NOT DETECTED Final   Pseudomonas aeruginosa NOT DETECTED NOT DETECTED Final   Candida albicans NOT DETECTED NOT DETECTED Final   Candida glabrata NOT DETECTED NOT DETECTED Final   Candida krusei NOT DETECTED NOT DETECTED Final   Candida parapsilosis NOT DETECTED NOT DETECTED Final   Candida tropicalis NOT DETECTED NOT  DETECTED Final  Culture, blood (routine x 2)     Status: None (Preliminary result)   Collection Time: 11/15/16  8:23 PM  Result Value Ref Range Status   Specimen Description BLOOD HEMODIALYSIS CATHETER  Final   Special Requests BOTTLES DRAWN AEROBIC AND ANAEROBIC 10CC EACH  Final   Culture NO GROWTH < 24 HOURS  Final   Report Status PENDING  Incomplete  Culture, blood (routine x 2)     Status: None (Preliminary result)   Collection Time: 11/15/16  8:27 PM  Result Value Ref Range Status   Specimen Description BLOOD HEMODIALYSIS CATHETER  Final   Special Requests BOTTLES DRAWN AEROBIC AND ANAEROBIC 10CC EACH  Final   Culture NO  GROWTH < 24 HOURS  Final   Report Status PENDING  Incomplete    Coagulation Studies:  Recent Labs  11/15/16 0652 11/16/16 0806 11/17/16 0528  LABPROT 21.5* 20.4* 20.7*  INR 1.84 1.72 1.75    Urinalysis: No results for input(s): COLORURINE, LABSPEC, PHURINE, GLUCOSEU, HGBUR, BILIRUBINUR, KETONESUR, PROTEINUR, UROBILINOGEN, NITRITE, LEUKOCYTESUR in the last 72 hours.  Invalid input(s): APPERANCEUR    Imaging: No results found.   Medications:       Assessment/ Plan:  83 y.o.  African American male with a PMHx of dementia, chronic kidney disease stage IV, congestive heart failure ejection fraction 25-30%, who was admitted to [facility] on 09/30/2016 for ongoing management of cholecystitis and volvulus status post exploratory laparotomy with colectomy. Patient was previously in [place]. Dialysis started at [facility] around 10/03/2016  1.  ESRD. ATN no recovery  2.  Anasarca   3.  Acute respiratory failure., vent dependent 4.  Metabolic acidosis. 5.  Anemia of CKD.  6.  Hyponatremia - volume overload  7.  Fungemia, R EJ catheter replaced 11/14/16, pt was kept catheter free for several days.     Plan:  Patient will be due for hemodialysis today. He continues to have considerable anasarca.  We will continue the patient on hemodialysis at  this time. He has required periodic albumin support as well. Continue to monitor CBC has most recent hemoglobin was low at 6.5. Recommend rechecking this today. As before however patient overall remains critically ill. He is requiring mechanical ventilation as well as dialysis. He is severely debilitated. As such his prognosis is very guarded.  LOS: 0 Ashtan Laton 3/9/20188:18 AM

## 2016-11-20 LAB — CBC
HCT: 26 % — ABNORMAL LOW (ref 39.0–52.0)
HEMATOCRIT: 21.2 % — AB (ref 39.0–52.0)
HEMATOCRIT: 24 % — AB (ref 39.0–52.0)
HEMOGLOBIN: 6.5 g/dL — AB (ref 13.0–17.0)
Hemoglobin: 8 g/dL — ABNORMAL LOW (ref 13.0–17.0)
Hemoglobin: 7.3 g/dL — ABNORMAL LOW (ref 13.0–17.0)
MCH: 28.9 pg (ref 26.0–34.0)
MCH: 29.9 pg (ref 26.0–34.0)
MCH: 29 pg (ref 26.0–34.0)
MCHC: 30.7 g/dL (ref 30.0–36.0)
MCHC: 30.8 g/dL (ref 30.0–36.0)
MCHC: 30.4 g/dL (ref 30.0–36.0)
MCV: 94.2 fL (ref 78.0–100.0)
MCV: 97 fL (ref 78.0–100.0)
MCV: 95.2 fL (ref 78.0–100.0)
PLATELETS: 231 10*3/uL (ref 150–400)
Platelets: 200 10*3/uL (ref 150–400)
Platelets: 204 10*3/uL (ref 150–400)
RBC: 2.25 MIL/uL — ABNORMAL LOW (ref 4.22–5.81)
RBC: 2.68 MIL/uL — AB (ref 4.22–5.81)
RBC: 2.52 MIL/uL — AB (ref 4.22–5.81)
RDW: 20.4 % — AB (ref 11.5–15.5)
RDW: 21.2 % — ABNORMAL HIGH (ref 11.5–15.5)
RDW: 20.5 % — ABNORMAL HIGH (ref 11.5–15.5)
WBC: 11.4 10*3/uL — AB (ref 4.0–10.5)
WBC: 9.4 10*3/uL (ref 4.0–10.5)
WBC: 9.1 10*3/uL (ref 4.0–10.5)

## 2016-11-20 LAB — CULTURE, BLOOD (ROUTINE X 2)
CULTURE: NO GROWTH
Culture: NO GROWTH

## 2016-11-20 LAB — PROTIME-INR
INR: 1.67
PROTHROMBIN TIME: 19.9 s — AB (ref 11.4–15.2)

## 2016-11-20 LAB — RENAL FUNCTION PANEL
Albumin: 1.9 g/dL — ABNORMAL LOW (ref 3.5–5.0)
Albumin: 2 g/dL — ABNORMAL LOW (ref 3.5–5.0)
Anion gap: 11 (ref 5–15)
Anion gap: 6 (ref 5–15)
BUN: 46 mg/dL — ABNORMAL HIGH (ref 6–20)
BUN: 26 mg/dL — ABNORMAL HIGH (ref 6–20)
CALCIUM: 8.2 mg/dL — AB (ref 8.9–10.3)
CHLORIDE: 97 mmol/L — AB (ref 101–111)
CO2: 22 mmol/L (ref 22–32)
CO2: 28 mmol/L (ref 22–32)
CREATININE: 3.53 mg/dL — AB (ref 0.61–1.24)
CREATININE: 2.12 mg/dL — AB (ref 0.61–1.24)
Calcium: 7.8 mg/dL — ABNORMAL LOW (ref 8.9–10.3)
Chloride: 96 mmol/L — ABNORMAL LOW (ref 101–111)
GFR calc non Af Amer: 27 mL/min — ABNORMAL LOW (ref >60)
GFR, EST AFRICAN AMERICAN: 17 mL/min — AB (ref >60)
GFR, EST AFRICAN AMERICAN: 32 mL/min — AB (ref >60)
GFR, EST NON AFRICAN AMERICAN: 15 mL/min — AB (ref >60)
Glucose, Bld: 106 mg/dL — ABNORMAL HIGH (ref 65–99)
Glucose, Bld: 106 mg/dL — ABNORMAL HIGH (ref 65–99)
Phosphorus: 3.3 mg/dL (ref 2.5–4.6)
Phosphorus: 2.3 mg/dL — ABNORMAL LOW (ref 2.5–4.6)
Potassium: 4.3 mmol/L (ref 3.5–5.1)
Potassium: 3.8 mmol/L (ref 3.5–5.1)
SODIUM: 129 mmol/L — AB (ref 135–145)
Sodium: 131 mmol/L — ABNORMAL LOW (ref 135–145)

## 2016-11-20 NOTE — Progress Notes (Signed)
[facility]  ROUNDING NOTE   Subjective:  Patient remains critically ill at this point in time. He will be due for hemodialysis later today. Trach placed Remains vent dependent Scheduled for PEG placement  Objective:  Vital signs in last 24 hours:  Temperature 97.7 pulse 80 respirations 24 blood pressure 115/76  Physical Exam: General: Critically ill-appearing  Head: NG in place  Eyes: Anicteric  Neck: traceostomy  Lungs:  Scattered rhonchi, on vent  Heart: S1S2 no rubs  Abdomen:  Soft,     Extremities: 2+ dependent edema  Neurologic: Not following commands  Skin: Warm & dry  Access: Right external jugular temporary dialysis catheter    Basic Metabolic Panel:  Recent Labs Lab 11/14/16 0754 11/15/16 1530 11/17/16 0528 11/20/16 0642  NA 134* 132* 133* 129*  K 5.2* 4.7 3.8 4.3  CL 98* 99* 97* 96*  CO2 24 23 27 22   GLUCOSE 112* 114* 110* 106*  BUN 76* 85* 47* 46*  CREATININE 4.55* 5.04* 3.48* 3.53*  CALCIUM 8.5* 8.0* 8.0* 8.2*  PHOS  --   --  2.9 3.3    Liver Function Tests:  Recent Labs Lab 11/17/16 0528 11/20/16 0642  ALBUMIN 1.8* 1.9*   No results for input(s): LIPASE, AMYLASE in the last 168 hours. No results for input(s): AMMONIA in the last 168 hours.  CBC:  Recent Labs Lab 11/14/16 0754 11/15/16 1530 11/16/16 0806 11/17/16 0527 11/17/16 1050 11/20/16 0642  WBC 13.0* 12.5* 10.6* 11.4*  --  9.4  NEUTROABS 10.1*  --   --   --   --   --   HGB 7.5* 7.0* 8.0* 6.5* 7.7* 8.0*  HCT 24.4* 22.3* 25.8* 21.2* 25.5* 26.0*  MCV 94.2 93.7 95.2 94.2  --  97.0  PLT 202 219 210 200  --  204    Cardiac Enzymes: No results for input(s): CKTOTAL, CKMB, CKMBINDEX, TROPONINI in the last 168 hours.  BNP: Invalid input(s): POCBNP  CBG: No results for input(s): GLUCAP in the last 168 hours.  Microbiology: Results for orders placed or performed during the hospital encounter of 09/30/16  Culture, blood (routine x 2)     Status: None   Collection Time: 10/06/16  4:19 PM  Result Value Ref Range Status   Specimen Description BLOOD HEMODIALYSIS CATHETER  Final   Special Requests BOTTLES DRAWN AEROBIC ONLY 5CC  Final   Culture NO GROWTH 5 DAYS  Final   Report Status 10/11/2016 FINAL  Final  Culture, blood (routine x 2)     Status: None   Collection Time: 10/06/16  4:20 PM  Result Value Ref Range Status   Specimen Description BLOOD HEMODIALYSIS CATHETER  Final   Special Requests BOTTLES DRAWN AEROBIC ONLY 10CC  Final   Culture NO GROWTH 5 DAYS  Final   Report Status 10/11/2016 FINAL  Final  Culture, respiratory (NON-Expectorated)     Status: None   Collection Time: 10/27/16  2:03 PM  Result Value Ref Range Status   Specimen Description TRACHEAL ASPIRATE  Final   Special Requests NONE  Final   Gram Stain   Final    ABUNDANT WBC PRESENT, PREDOMINANTLY MONONUCLEAR ABUNDANT GRAM POSITIVE COCCI IN PAIRS ABUNDANT GRAM NEGATIVE COCCI IN PAIRS    Culture   Final    MODERATE METHICILLIN RESISTANT STAPHYLOCOCCUS AUREUS   Report Status 10/30/2016 FINAL  Final   Organism ID, Bacteria METHICILLIN RESISTANT STAPHYLOCOCCUS AUREUS  Final      Susceptibility   Methicillin resistant staphylococcus aureus -  MIC*    CIPROFLOXACIN >=8 RESISTANT Resistant     ERYTHROMYCIN >=8 RESISTANT Resistant     GENTAMICIN <=0.5 SENSITIVE Sensitive     OXACILLIN >=4 RESISTANT Resistant     TETRACYCLINE <=1 SENSITIVE Sensitive     VANCOMYCIN 1 SENSITIVE Sensitive     TRIMETH/SULFA <=10 SENSITIVE Sensitive     CLINDAMYCIN <=0.25 SENSITIVE Sensitive     RIFAMPIN <=0.5 SENSITIVE Sensitive     Inducible Clindamycin NEGATIVE Sensitive     * MODERATE METHICILLIN RESISTANT STAPHYLOCOCCUS AUREUS  Culture, blood (routine x 2)     Status: None   Collection Time: 11/06/16  6:50 PM  Result Value Ref Range Status   Specimen Description BLOOD RIGHT HAND  Final   Special Requests IN PEDIATRIC BOTTLE 2CC  Final   Culture NO GROWTH 5 DAYS  Final   Report  Status 11/11/2016 FINAL  Final  Culture, blood (routine x 2)     Status: Abnormal   Collection Time: 11/06/16  7:00 PM  Result Value Ref Range Status   Specimen Description BLOOD LEFT HAND  Final   Special Requests BOTTLES DRAWN AEROBIC AND ANAEROBIC 5CC  Final   Culture  Setup Time   Final    BUDDING YEAST SEEN AEROBIC BOTTLE ONLY CRITICAL RESULT CALLED TO, READ BACK BY AND VERIFIED WITH: N SMITH,RN AT 1707 11/09/16 BY L BENFIELD    Culture CANDIDA SPECIES, NOT ALBICANS (A)  Final   Report Status 11/11/2016 FINAL  Final  Blood Culture ID Panel (Reflexed)     Status: None   Collection Time: 11/06/16  7:00 PM  Result Value Ref Range Status   Enterococcus species NOT DETECTED NOT DETECTED Final   Listeria monocytogenes NOT DETECTED NOT DETECTED Final   Staphylococcus species NOT DETECTED NOT DETECTED Final   Staphylococcus aureus NOT DETECTED NOT DETECTED Final   Streptococcus species NOT DETECTED NOT DETECTED Final   Streptococcus agalactiae NOT DETECTED NOT DETECTED Final   Streptococcus pneumoniae NOT DETECTED NOT DETECTED Final   Streptococcus pyogenes NOT DETECTED NOT DETECTED Final   Acinetobacter baumannii NOT DETECTED NOT DETECTED Final   Enterobacteriaceae species NOT DETECTED NOT DETECTED Final   Enterobacter cloacae complex NOT DETECTED NOT DETECTED Final   Escherichia coli NOT DETECTED NOT DETECTED Final   Klebsiella oxytoca NOT DETECTED NOT DETECTED Final   Klebsiella pneumoniae NOT DETECTED NOT DETECTED Final   Proteus species NOT DETECTED NOT DETECTED Final   Serratia marcescens NOT DETECTED NOT DETECTED Final   Haemophilus influenzae NOT DETECTED NOT DETECTED Final   Neisseria meningitidis NOT DETECTED NOT DETECTED Final   Pseudomonas aeruginosa NOT DETECTED NOT DETECTED Final   Candida albicans NOT DETECTED NOT DETECTED Final   Candida glabrata NOT DETECTED NOT DETECTED Final   Candida krusei NOT DETECTED NOT DETECTED Final   Candida parapsilosis NOT DETECTED NOT  DETECTED Final   Candida tropicalis NOT DETECTED NOT DETECTED Final  Culture, blood (routine x 2)     Status: None (Preliminary result)   Collection Time: 11/15/16  8:23 PM  Result Value Ref Range Status   Specimen Description BLOOD HEMODIALYSIS CATHETER  Final   Special Requests BOTTLES DRAWN AEROBIC AND ANAEROBIC 10CC EACH  Final   Culture NO GROWTH 4 DAYS  Final   Report Status PENDING  Incomplete  Culture, blood (routine x 2)     Status: None (Preliminary result)   Collection Time: 11/15/16  8:27 PM  Result Value Ref Range Status   Specimen Description BLOOD HEMODIALYSIS CATHETER  Final   Special Requests BOTTLES DRAWN AEROBIC AND ANAEROBIC 10CC EACH  Final   Culture NO GROWTH 4 DAYS  Final   Report Status PENDING  Incomplete    Coagulation Studies: No results for input(s): LABPROT, INR in the last 72 hours.  Urinalysis: No results for input(s): COLORURINE, LABSPEC, PHURINE, GLUCOSEU, HGBUR, BILIRUBINUR, KETONESUR, PROTEINUR, UROBILINOGEN, NITRITE, LEUKOCYTESUR in the last 72 hours.  Invalid input(s): APPERANCEUR    Imaging: No results found.   Medications:       Assessment/ Plan:  83 y.o.  African American male with a PMHx of dementia, chronic kidney disease stage IV, congestive heart failure ejection fraction 25-30%, who was admitted to [facility] on 09/30/2016 for ongoing management of cholecystitis and volvulus status post exploratory laparotomy with colectomy. Patient was previously in [place]. Dialysis started at [facility] around 10/03/2016  1.  ESRD. ATN no recovery  2.  Anasarca   3.  Acute respiratory failure., vent dependent 4.  Metabolic acidosis. 5.  Anemia of CKD.  6.  Hyponatremia - volume overload  7.  Fungemia, R EJ catheter replaced 11/14/16, pt was kept catheter free for several days.     Plan:  Patient will be due for hemodialysis today. He continues to have considerable anasarca.  We will continue the patient on hemodialysis at this  time. He has required periodic albumin support as well. Continue to monitor CBC has most recent hemoglobin is now 8.0  As before however patient overall remains critically ill. He is requiring mechanical ventilation as well as dialysis. He is severely debilitated. As such his prognosis is very guarded.   LOS: 0 Staphany Ditton 3/12/201810:05 AM

## 2016-11-21 LAB — TYPE AND SCREEN
ABO/RH(D): A POS
Antibody Screen: NEGATIVE
Unit division: 0

## 2016-11-21 LAB — PROTIME-INR
INR: 1.86
Prothrombin Time: 21.7 seconds — ABNORMAL HIGH (ref 11.4–15.2)

## 2016-11-21 LAB — BPAM RBC
Blood Product Expiration Date: 201803292359
Unit Type and Rh: 6200

## 2016-11-21 NOTE — Progress Notes (Signed)
Patient ID: Jesus Vega, male   DOB: Apr 29, 1932, 83 y.o.   MRN: [id number]   INR 1.86 today Pt already on Vit K 1 mg SQ daily per MD  Plan: FFP 6 am 3/14; recheck INR stat after FFP If coming down; can move ahead 3/14 --- per Dr Fredia SorrowYamagata

## 2016-11-22 ENCOUNTER — Encounter (HOSPITAL_COMMUNITY): Payer: Self-pay | Admitting: Interventional Radiology

## 2016-11-22 ENCOUNTER — Other Ambulatory Visit (HOSPITAL_COMMUNITY): Payer: Medicare Other

## 2016-11-22 HISTORY — PX: IR GENERIC HISTORICAL: IMG1180011

## 2016-11-22 LAB — RENAL FUNCTION PANEL
ANION GAP: 9 (ref 5–15)
Albumin: 2.4 g/dL — ABNORMAL LOW (ref 3.5–5.0)
BUN: 32 mg/dL — AB (ref 6–20)
CHLORIDE: 97 mmol/L — AB (ref 101–111)
CO2: 25 mmol/L (ref 22–32)
Calcium: 8.3 mg/dL — ABNORMAL LOW (ref 8.9–10.3)
Creatinine, Ser: 2.96 mg/dL — ABNORMAL HIGH (ref 0.61–1.24)
GFR calc Af Amer: 21 mL/min — ABNORMAL LOW (ref >60)
GFR, EST NON AFRICAN AMERICAN: 18 mL/min — AB (ref >60)
Glucose, Bld: 122 mg/dL — ABNORMAL HIGH (ref 65–99)
POTASSIUM: 3.5 mmol/L (ref 3.5–5.1)
Phosphorus: 3.4 mg/dL (ref 2.5–4.6)
Sodium: 131 mmol/L — ABNORMAL LOW (ref 135–145)

## 2016-11-22 LAB — CBC
HEMATOCRIT: 22.5 % — AB (ref 39.0–52.0)
HEMOGLOBIN: 7.1 g/dL — AB (ref 13.0–17.0)
MCH: 30.3 pg (ref 26.0–34.0)
MCHC: 31.6 g/dL (ref 30.0–36.0)
MCV: 96.2 fL (ref 78.0–100.0)
Platelets: 212 10*3/uL (ref 150–400)
RBC: 2.34 MIL/uL — AB (ref 4.22–5.81)
RDW: 20.5 % — AB (ref 11.5–15.5)
WBC: 8.7 10*3/uL (ref 4.0–10.5)

## 2016-11-22 LAB — PROTIME-INR
INR: 1.69
PROTHROMBIN TIME: 20.1 s — AB (ref 11.4–15.2)

## 2016-11-22 MED ORDER — FENTANYL CITRATE (PF) 100 MCG/2ML IJ SOLN
INTRAMUSCULAR | Status: AC
  Filled 2016-11-22: qty 2

## 2016-11-22 MED ORDER — FENTANYL CITRATE (PF) 100 MCG/2ML IJ SOLN
INTRAMUSCULAR | Status: AC | PRN
  Administered 2016-11-22: 25 ug via INTRAVENOUS

## 2016-11-22 MED ORDER — MIDAZOLAM HCL 2 MG/2ML IJ SOLN
INTRAMUSCULAR | Status: AC
  Filled 2016-11-22: qty 2

## 2016-11-22 MED ORDER — MIDAZOLAM HCL 2 MG/2ML IJ SOLN
INTRAMUSCULAR | Status: AC | PRN
  Administered 2016-11-22: 1 mg via INTRAVENOUS

## 2016-11-22 MED ORDER — IOPAMIDOL (ISOVUE-300) INJECTION 61%
INTRAVENOUS | Status: AC
  Administered 2016-11-22: 15 mL
  Filled 2016-11-22: qty 50

## 2016-11-22 MED ORDER — GLUCAGON HCL RDNA (DIAGNOSTIC) 1 MG IJ SOLR
INTRAMUSCULAR | Status: AC
  Filled 2016-11-22: qty 1

## 2016-11-22 MED ORDER — LIDOCAINE-EPINEPHRINE (PF) 1 %-1:200000 IJ SOLN
INTRAMUSCULAR | Status: AC
  Filled 2016-11-22: qty 30

## 2016-11-22 MED ORDER — CEFAZOLIN SODIUM-DEXTROSE 2-4 GM/100ML-% IV SOLN
INTRAVENOUS | Status: AC
  Administered 2016-11-22: 2000 mg
  Filled 2016-11-22: qty 100

## 2016-11-22 MED ORDER — LIDOCAINE HCL (PF) 1 % IJ SOLN
INTRAMUSCULAR | Status: AC | PRN
  Administered 2016-11-22: 5 mL

## 2016-11-22 MED ORDER — GLUCAGON HCL RDNA (DIAGNOSTIC) 1 MG IJ SOLR
INTRAMUSCULAR | Status: AC | PRN
  Administered 2016-11-22: 1 mg via INTRAVENOUS

## 2016-11-22 NOTE — Sedation Documentation (Signed)
Vital signs stable. 

## 2016-11-22 NOTE — Progress Notes (Signed)
[facility]  ROUNDING NOTE   Subjective:  Patient remains critically ill at this point in time. Patient underwent PEG tube placement by vascular radiology Seen postoperatively. No acute distress Resting quietly  Objective:  Vital signs in last 24 hours:  Temperature  97.4, pulse 86, respirations 27, blood pressure 97/66  Physical Exam: General: Critically ill-appearing  Head: NG in place  Eyes: Anicteric  Neck: traceostomy  Lungs:  Scattered rhonchi, on vent  Heart: S1S2 no rubs  Abdomen:  PEG in place, colostomy,   Extremities: 2+ dependent edema over legs, feet in soft boot support  Neurologic: Not following commands  Skin: Warm & dry  Access: Right external jugular temporary dialysis catheter    Basic Metabolic Panel:  Recent Labs Lab 11/17/16 0528 11/20/16 0642 11/20/16 2045 11/22/16 0500  NA 133* 129* 131* 131*  K 3.8 4.3 3.8 3.5  CL 97* 96* 97* 97*  CO2 27 22 28 25   GLUCOSE 110* 106* 106* 122*  BUN 47* 46* 26* 32*  CREATININE 3.48* 3.53* 2.12* 2.96*  CALCIUM 8.0* 8.2* 7.8* 8.3*  PHOS 2.9 3.3 2.3* 3.4    Liver Function Tests:  Recent Labs Lab 11/17/16 0528 11/20/16 0642 11/20/16 2045 11/22/16 0500  ALBUMIN 1.8* 1.9* 2.0* 2.4*   No results for input(s): LIPASE, AMYLASE in the last 168 hours. No results for input(s): AMMONIA in the last 168 hours.  CBC:  Recent Labs Lab 11/16/16 0806 11/17/16 0527 11/17/16 1050 11/20/16 0642 11/20/16 2045 11/22/16 0500  WBC 10.6* 11.4*  --  9.4 9.1 8.7  HGB 8.0* 6.5* 7.7* 8.0* 7.3* 7.1*  HCT 25.8* 21.2* 25.5* 26.0* 24.0* 22.5*  MCV 95.2 94.2  --  97.0 95.2 96.2  PLT 210 200  --  204 231 212    Cardiac Enzymes: No results for input(s): CKTOTAL, CKMB, CKMBINDEX, TROPONINI in the last 168 hours.  BNP: Invalid input(s): POCBNP  CBG: No results for input(s): GLUCAP in the last 168 hours.  Microbiology: Results for orders placed or performed during the hospital encounter of 09/30/16   Culture, blood (routine x 2)     Status: None   Collection Time: 10/06/16  4:19 PM  Result Value Ref Range Status   Specimen Description BLOOD HEMODIALYSIS CATHETER  Final   Special Requests BOTTLES DRAWN AEROBIC ONLY 5CC  Final   Culture NO GROWTH 5 DAYS  Final   Report Status 10/11/2016 FINAL  Final  Culture, blood (routine x 2)     Status: None   Collection Time: 10/06/16  4:20 PM  Result Value Ref Range Status   Specimen Description BLOOD HEMODIALYSIS CATHETER  Final   Special Requests BOTTLES DRAWN AEROBIC ONLY 10CC  Final   Culture NO GROWTH 5 DAYS  Final   Report Status 10/11/2016 FINAL  Final  Culture, respiratory (NON-Expectorated)     Status: None   Collection Time: 10/27/16  2:03 PM  Result Value Ref Range Status   Specimen Description TRACHEAL ASPIRATE  Final   Special Requests NONE  Final   Gram Stain   Final    ABUNDANT WBC PRESENT, PREDOMINANTLY MONONUCLEAR ABUNDANT GRAM POSITIVE COCCI IN PAIRS ABUNDANT GRAM NEGATIVE COCCI IN PAIRS    Culture   Final    MODERATE METHICILLIN RESISTANT STAPHYLOCOCCUS AUREUS   Report Status 10/30/2016 FINAL  Final   Organism ID, Bacteria METHICILLIN RESISTANT STAPHYLOCOCCUS AUREUS  Final      Susceptibility   Methicillin resistant staphylococcus aureus - MIC*    CIPROFLOXACIN >=8 RESISTANT  Resistant     ERYTHROMYCIN >=8 RESISTANT Resistant     GENTAMICIN <=0.5 SENSITIVE Sensitive     OXACILLIN >=4 RESISTANT Resistant     TETRACYCLINE <=1 SENSITIVE Sensitive     VANCOMYCIN 1 SENSITIVE Sensitive     TRIMETH/SULFA <=10 SENSITIVE Sensitive     CLINDAMYCIN <=0.25 SENSITIVE Sensitive     RIFAMPIN <=0.5 SENSITIVE Sensitive     Inducible Clindamycin NEGATIVE Sensitive     * MODERATE METHICILLIN RESISTANT STAPHYLOCOCCUS AUREUS  Culture, blood (routine x 2)     Status: None   Collection Time: 11/06/16  6:50 PM  Result Value Ref Range Status   Specimen Description BLOOD RIGHT HAND  Final   Special Requests IN PEDIATRIC BOTTLE 2CC   Final   Culture NO GROWTH 5 DAYS  Final   Report Status 11/11/2016 FINAL  Final  Culture, blood (routine x 2)     Status: Abnormal   Collection Time: 11/06/16  7:00 PM  Result Value Ref Range Status   Specimen Description BLOOD LEFT HAND  Final   Special Requests BOTTLES DRAWN AEROBIC AND ANAEROBIC 5CC  Final   Culture  Setup Time   Final    BUDDING YEAST SEEN AEROBIC BOTTLE ONLY CRITICAL RESULT CALLED TO, READ BACK BY AND VERIFIED WITH: N SMITH,RN AT 1707 11/09/16 BY L BENFIELD    Culture CANDIDA SPECIES, NOT ALBICANS (A)  Final   Report Status 11/11/2016 FINAL  Final  Blood Culture ID Panel (Reflexed)     Status: None   Collection Time: 11/06/16  7:00 PM  Result Value Ref Range Status   Enterococcus species NOT DETECTED NOT DETECTED Final   Listeria monocytogenes NOT DETECTED NOT DETECTED Final   Staphylococcus species NOT DETECTED NOT DETECTED Final   Staphylococcus aureus NOT DETECTED NOT DETECTED Final   Streptococcus species NOT DETECTED NOT DETECTED Final   Streptococcus agalactiae NOT DETECTED NOT DETECTED Final   Streptococcus pneumoniae NOT DETECTED NOT DETECTED Final   Streptococcus pyogenes NOT DETECTED NOT DETECTED Final   Acinetobacter baumannii NOT DETECTED NOT DETECTED Final   Enterobacteriaceae species NOT DETECTED NOT DETECTED Final   Enterobacter cloacae complex NOT DETECTED NOT DETECTED Final   Escherichia coli NOT DETECTED NOT DETECTED Final   Klebsiella oxytoca NOT DETECTED NOT DETECTED Final   Klebsiella pneumoniae NOT DETECTED NOT DETECTED Final   Proteus species NOT DETECTED NOT DETECTED Final   Serratia marcescens NOT DETECTED NOT DETECTED Final   Haemophilus influenzae NOT DETECTED NOT DETECTED Final   Neisseria meningitidis NOT DETECTED NOT DETECTED Final   Pseudomonas aeruginosa NOT DETECTED NOT DETECTED Final   Candida albicans NOT DETECTED NOT DETECTED Final   Candida glabrata NOT DETECTED NOT DETECTED Final   Candida krusei NOT DETECTED NOT  DETECTED Final   Candida parapsilosis NOT DETECTED NOT DETECTED Final   Candida tropicalis NOT DETECTED NOT DETECTED Final  Culture, blood (routine x 2)     Status: None   Collection Time: 11/15/16  8:23 PM  Result Value Ref Range Status   Specimen Description BLOOD HEMODIALYSIS CATHETER  Final   Special Requests BOTTLES DRAWN AEROBIC AND ANAEROBIC 10CC EACH  Final   Culture NO GROWTH 5 DAYS  Final   Report Status 11/20/2016 FINAL  Final  Culture, blood (routine x 2)     Status: None   Collection Time: 11/15/16  8:27 PM  Result Value Ref Range Status   Specimen Description BLOOD HEMODIALYSIS CATHETER  Final   Special Requests BOTTLES DRAWN AEROBIC AND  ANAEROBIC 10CC EACH  Final   Culture NO GROWTH 5 DAYS  Final   Report Status 11/20/2016 FINAL  Final    Coagulation Studies:  Recent Labs  11/20/16 2020 11/21/16 0500 11/22/16 0500  LABPROT 19.9* 21.7* 20.1*  INR 1.67 1.86 1.69    Urinalysis: No results for input(s): COLORURINE, LABSPEC, PHURINE, GLUCOSEU, HGBUR, BILIRUBINUR, KETONESUR, PROTEINUR, UROBILINOGEN, NITRITE, LEUKOCYTESUR in the last 72 hours.  Invalid input(s): APPERANCEUR    Imaging: Ir Gastrostomy Tube Mod Sed  Result Date: 11/22/2016 INDICATION: Dysphagia. Please place percutaneous gastrostomy tube for enteric nutrition supplementation. EXAM: PULL TROUGH GASTROSTOMY TUBE PLACEMENT COMPARISON:  CT abdomen pelvis - 10/23/2016 MEDICATIONS: The patient is currently admitted to the hospital and receiving intravenous antibiotics. Glucagon 1 mg IV CONTRAST:  15 mL of Isovue 300 administered into the gastric lumen. ANESTHESIA/SEDATION: Moderate (conscious) sedation was employed during this procedure. A total of Versed 1 mg and Fentanyl 50 mcg was administered intravenously. Moderate Sedation Time: 10 minutes. The patient's level of consciousness and vital signs were monitored continuously by radiology nursing throughout the procedure under my direct supervision. FLUOROSCOPY  TIME:  3 minutes 48 seconds (43.3 MGy) COMPLICATIONS: None immediate. PROCEDURE: Informed written consent was obtained from the patient following explanation of the procedure, risks, benefits and alternatives. A time out was performed prior to the initiation of the procedure. Ultrasound scanning was performed to demarcate the edge of the left lobe of the liver. Maximal barrier sterile technique utilized including caps, mask, sterile gowns, sterile gloves, large sterile drape, hand hygiene and Betadine prep. The left upper quadrant was sterilely prepped and draped. An oral gastric catheter was inserted into the stomach under fluoroscopy. The existing nasogastric feeding tube was removed. The left costal margin and air opacified transverse colon were identified and avoided. Air was injected into the stomach for insufflation and visualization under fluoroscopy. Under sterile conditions a 17 gauge trocar needle was utilized to access the stomach percutaneously beneath the left subcostal margin after the overlying soft tissues were anesthetized with 1% Lidocaine with epinephrine. Needle position was confirmed within the stomach with aspiration of air and injection of small amount of contrast. A single T tack was deployed for gastropexy. Over an Amplatz guide wire, a 9-French sheath was inserted into the stomach. A snare device was utilized to capture the oral gastric catheter. The snare device was pulled retrograde from the stomach up the esophagus and out the oropharynx. The 20-French pull-through gastrostomy was connected to the snare device and pulled antegrade through the oropharynx down the esophagus into the stomach and then through the percutaneous tract external to the patient. The gastrostomy was assembled externally. Contrast injection confirms position in the stomach. Several spot radiographic images were obtained in various obliquities for documentation. The patient tolerated procedure well without immediate  post procedural complication. FINDINGS: After successful fluoroscopic guided placement, the gastrostomy tube is appropriately positioned with internal disc against the ventral aspect of the gastric lumen. IMPRESSION: Successful fluoroscopic insertion of a 20-French pull-through gastrostomy tube. The gastrostomy may be used immediately for medication administration and in 24 hrs for the initiation of feeds. Electronically Signed   By: Simonne Come M.D.   On: 11/22/2016 16:13     Medications:   Aranesp 30 mcg q. Wednesday Famotidine Iron sulfate tablets Midrin 5 mg twice a day miraLax daily Multivitamins     Assessment/ Plan:  83 y.o.  African American male with a PMHx of dementia, chronic kidney disease stage IV, congestive heart failure ejection  fraction 25-30%, who was admitted to [facility] on 09/30/2016 for ongoing management of cholecystitis and volvulus status post exploratory laparotomy with colectomy. Patient was previously in [place]. Dialysis started at [facility] around 10/03/2016  1.  ESRD. ATN no recovery  2.  Anasarca   3.  Acute respiratory failure., vent dependent 4.  Metabolic acidosis. 5.  Anemia of CKD.  6.  Hyponatremia - volume overload  7.  Fungemia, R EJ catheter replaced 11/14/16, pt was kept catheter free for several days.     Plan:  Patient will be due for hemodialysis today. He continues to have considerable anasarca.  We will continue the patient on hemodialysis at this time. He has required periodic albumin support as well. Continue to monitor CBC has most recent hemoglobin is now 7.1 Increase dose of Aranesp  As before patient overall remains critically ill. He is requiring mechanical ventilation as well as dialysis. He is severely debilitated. As such his prognosis is very guarded.   LOS: 0 Jospeh Mangel 3/14/20185:34 PM

## 2016-11-22 NOTE — Procedures (Signed)
Pre procedure Dx: Dysphagia Post Procedure Dx: Same  Successful fluoroscopic guided insertion of gastrostomy tube.   The gastrostomy tube may be used immediately for medications.   Tube feeds may be initiated in 24 hours as per the primary team.    EBL: Minimal  Complications: None immediate  Jay Aireal Slater, MD Pager #: [phone]    

## 2016-11-22 NOTE — Sedation Documentation (Signed)
Patient is resting but anxious

## 2016-11-23 LAB — PREPARE FRESH FROZEN PLASMA
UNIT DIVISION: 0
UNIT DIVISION: 0
Unit division: 0

## 2016-11-23 LAB — BPAM FFP
BLOOD PRODUCT EXPIRATION DATE: 201803182359
Blood Product Expiration Date: 201803182359
Blood Product Expiration Date: 201803182359
ISSUE DATE / TIME: 201803140427
ISSUE DATE / TIME: 201803140427
ISSUE DATE / TIME: 201803140305
UNIT TYPE AND RH: 6200
UNIT TYPE AND RH: 6200
UNIT TYPE AND RH: 6200

## 2016-11-23 NOTE — Progress Notes (Signed)
s/p G-tube yesterday. Site clean, tube intact Ok to begin use for TF Call IR with issues.   Brayton ElKevin Kazuto Sevey PA-C Interventional Radiology 11/23/2016 11:09 AM

## 2016-11-24 LAB — CBC
HCT: 24 % — ABNORMAL LOW (ref 39.0–52.0)
Hemoglobin: 7.3 g/dL — ABNORMAL LOW (ref 13.0–17.0)
MCH: 29 pg (ref 26.0–34.0)
MCHC: 30.4 g/dL (ref 30.0–36.0)
MCV: 95.2 fL (ref 78.0–100.0)
PLATELETS: 207 10*3/uL (ref 150–400)
RBC: 2.52 MIL/uL — AB (ref 4.22–5.81)
RDW: 20.3 % — AB (ref 11.5–15.5)
WBC: 7 10*3/uL (ref 4.0–10.5)

## 2016-11-24 LAB — RENAL FUNCTION PANEL
Albumin: 2.1 g/dL — ABNORMAL LOW (ref 3.5–5.0)
Anion gap: 7 (ref 5–15)
BUN: 24 mg/dL — ABNORMAL HIGH (ref 6–20)
CALCIUM: 8.3 mg/dL — AB (ref 8.9–10.3)
CO2: 27 mmol/L (ref 22–32)
CREATININE: 2.59 mg/dL — AB (ref 0.61–1.24)
Chloride: 97 mmol/L — ABNORMAL LOW (ref 101–111)
GFR, EST AFRICAN AMERICAN: 25 mL/min — AB (ref >60)
GFR, EST NON AFRICAN AMERICAN: 21 mL/min — AB (ref >60)
Glucose, Bld: 125 mg/dL — ABNORMAL HIGH (ref 65–99)
Phosphorus: 2.7 mg/dL (ref 2.5–4.6)
Potassium: 3.2 mmol/L — ABNORMAL LOW (ref 3.5–5.1)
SODIUM: 131 mmol/L — AB (ref 135–145)

## 2016-11-24 NOTE — Progress Notes (Signed)
[facility]  ROUNDING NOTE   Subjective:  Patient remains critically ill at this point in time.  Resting quietly. Opens yes to voice but does not follow any commands tube feeds continued at 30 cc He has been on trach collar for 48 hours. Has not required ventilator  Objective:  Vital signs in last 24 hours:  Temperature 90.7, pulse 70, respirations 12, blood pressure 104/51  Physical Exam:    General: Critically ill-appearing  Eyes: Anicteric  Neck: traceostomy  Lungs:  Scattered rhonchi,  Heart: S1S2 no rubs  Abdomen:  PEG in place, colostomy,   Extremities: 2+ dependent edema over legs, feet in soft boot support  Neurologic: Not following commands  Skin: Warm & dry  Access: Right external jugular temporary dialysis catheter    Basic Metabolic Panel:  Recent Labs Lab 11/20/16 0642 11/20/16 2045 11/22/16 0500 11/24/16 0609  NA 129* 131* 131* 131*  K 4.3 3.8 3.5 3.2*  CL 96* 97* 97* 97*  CO2 22 28 25 27   GLUCOSE 106* 106* 122* 125*  BUN 46* 26* 32* 24*  CREATININE 3.53* 2.12* 2.96* 2.59*  CALCIUM 8.2* 7.8* 8.3* 8.3*  PHOS 3.3 2.3* 3.4 2.7    Liver Function Tests:  Recent Labs Lab 11/20/16 0642 11/20/16 2045 11/22/16 0500 11/24/16 0609  ALBUMIN 1.9* 2.0* 2.4* 2.1*   No results for input(s): LIPASE, AMYLASE in the last 168 hours. No results for input(s): AMMONIA in the last 168 hours.  CBC:  Recent Labs Lab 11/17/16 1050 11/20/16 0642 11/20/16 2045 11/22/16 0500 11/24/16 0609  WBC  --  9.4 9.1 8.7 7.0  HGB 7.7* 8.0* 7.3* 7.1* 7.3*  HCT 25.5* 26.0* 24.0* 22.5* 24.0*  MCV  --  97.0 95.2 96.2 95.2  PLT  --  204 231 212 207    Cardiac Enzymes: No results for input(s): CKTOTAL, CKMB, CKMBINDEX, TROPONINI in the last 168 hours.  BNP: Invalid input(s): POCBNP  CBG: No results for input(s): GLUCAP in the last 168 hours.  Microbiology: Results for orders placed or performed during the hospital encounter of 09/30/16  Culture,  blood (routine x 2)     Status: None   Collection Time: 10/06/16  4:19 PM  Result Value Ref Range Status   Specimen Description BLOOD HEMODIALYSIS CATHETER  Final   Special Requests BOTTLES DRAWN AEROBIC ONLY 5CC  Final   Culture NO GROWTH 5 DAYS  Final   Report Status 10/11/2016 FINAL  Final  Culture, blood (routine x 2)     Status: None   Collection Time: 10/06/16  4:20 PM  Result Value Ref Range Status   Specimen Description BLOOD HEMODIALYSIS CATHETER  Final   Special Requests BOTTLES DRAWN AEROBIC ONLY 10CC  Final   Culture NO GROWTH 5 DAYS  Final   Report Status 10/11/2016 FINAL  Final  Culture, respiratory (NON-Expectorated)     Status: None   Collection Time: 10/27/16  2:03 PM  Result Value Ref Range Status   Specimen Description TRACHEAL ASPIRATE  Final   Special Requests NONE  Final   Gram Stain   Final    ABUNDANT WBC PRESENT, PREDOMINANTLY MONONUCLEAR ABUNDANT GRAM POSITIVE COCCI IN PAIRS ABUNDANT GRAM NEGATIVE COCCI IN PAIRS    Culture   Final    MODERATE METHICILLIN RESISTANT STAPHYLOCOCCUS AUREUS   Report Status 10/30/2016 FINAL  Final   Organism ID, Bacteria METHICILLIN RESISTANT STAPHYLOCOCCUS AUREUS  Final      Susceptibility   Methicillin resistant staphylococcus aureus - MIC*  CIPROFLOXACIN >=8 RESISTANT Resistant     ERYTHROMYCIN >=8 RESISTANT Resistant     GENTAMICIN <=0.5 SENSITIVE Sensitive     OXACILLIN >=4 RESISTANT Resistant     TETRACYCLINE <=1 SENSITIVE Sensitive     VANCOMYCIN 1 SENSITIVE Sensitive     TRIMETH/SULFA <=10 SENSITIVE Sensitive     CLINDAMYCIN <=0.25 SENSITIVE Sensitive     RIFAMPIN <=0.5 SENSITIVE Sensitive     Inducible Clindamycin NEGATIVE Sensitive     * MODERATE METHICILLIN RESISTANT STAPHYLOCOCCUS AUREUS  Culture, blood (routine x 2)     Status: None   Collection Time: 11/06/16  6:50 PM  Result Value Ref Range Status   Specimen Description BLOOD RIGHT HAND  Final   Special Requests IN PEDIATRIC BOTTLE 2CC  Final    Culture NO GROWTH 5 DAYS  Final   Report Status 11/11/2016 FINAL  Final  Culture, blood (routine x 2)     Status: Abnormal   Collection Time: 11/06/16  7:00 PM  Result Value Ref Range Status   Specimen Description BLOOD LEFT HAND  Final   Special Requests BOTTLES DRAWN AEROBIC AND ANAEROBIC 5CC  Final   Culture  Setup Time   Final    BUDDING YEAST SEEN AEROBIC BOTTLE ONLY CRITICAL RESULT CALLED TO, READ BACK BY AND VERIFIED WITH: N SMITH,RN AT 1707 11/09/16 BY L BENFIELD    Culture CANDIDA SPECIES, NOT ALBICANS (A)  Final   Report Status 11/11/2016 FINAL  Final  Blood Culture ID Panel (Reflexed)     Status: None   Collection Time: 11/06/16  7:00 PM  Result Value Ref Range Status   Enterococcus species NOT DETECTED NOT DETECTED Final   Listeria monocytogenes NOT DETECTED NOT DETECTED Final   Staphylococcus species NOT DETECTED NOT DETECTED Final   Staphylococcus aureus NOT DETECTED NOT DETECTED Final   Streptococcus species NOT DETECTED NOT DETECTED Final   Streptococcus agalactiae NOT DETECTED NOT DETECTED Final   Streptococcus pneumoniae NOT DETECTED NOT DETECTED Final   Streptococcus pyogenes NOT DETECTED NOT DETECTED Final   Acinetobacter baumannii NOT DETECTED NOT DETECTED Final   Enterobacteriaceae species NOT DETECTED NOT DETECTED Final   Enterobacter cloacae complex NOT DETECTED NOT DETECTED Final   Escherichia coli NOT DETECTED NOT DETECTED Final   Klebsiella oxytoca NOT DETECTED NOT DETECTED Final   Klebsiella pneumoniae NOT DETECTED NOT DETECTED Final   Proteus species NOT DETECTED NOT DETECTED Final   Serratia marcescens NOT DETECTED NOT DETECTED Final   Haemophilus influenzae NOT DETECTED NOT DETECTED Final   Neisseria meningitidis NOT DETECTED NOT DETECTED Final   Pseudomonas aeruginosa NOT DETECTED NOT DETECTED Final   Candida albicans NOT DETECTED NOT DETECTED Final   Candida glabrata NOT DETECTED NOT DETECTED Final   Candida krusei NOT DETECTED NOT DETECTED Final    Candida parapsilosis NOT DETECTED NOT DETECTED Final   Candida tropicalis NOT DETECTED NOT DETECTED Final  Culture, blood (routine x 2)     Status: None   Collection Time: 11/15/16  8:23 PM  Result Value Ref Range Status   Specimen Description BLOOD HEMODIALYSIS CATHETER  Final   Special Requests BOTTLES DRAWN AEROBIC AND ANAEROBIC 10CC EACH  Final   Culture NO GROWTH 5 DAYS  Final   Report Status 11/20/2016 FINAL  Final  Culture, blood (routine x 2)     Status: None   Collection Time: 11/15/16  8:27 PM  Result Value Ref Range Status   Specimen Description BLOOD HEMODIALYSIS CATHETER  Final   Special Requests BOTTLES  DRAWN AEROBIC AND ANAEROBIC 10CC EACH  Final   Culture NO GROWTH 5 DAYS  Final   Report Status 11/20/2016 FINAL  Final    Coagulation Studies:  Recent Labs  11/22/16 0500  LABPROT 20.1*  INR 1.69    Urinalysis: No results for input(s): COLORURINE, LABSPEC, PHURINE, GLUCOSEU, HGBUR, BILIRUBINUR, KETONESUR, PROTEINUR, UROBILINOGEN, NITRITE, LEUKOCYTESUR in the last 72 hours.  Invalid input(s): APPERANCEUR    Imaging: Ir Gastrostomy Tube Mod Sed  Result Date: 11/22/2016 INDICATION: Dysphagia. Please place percutaneous gastrostomy tube for enteric nutrition supplementation. EXAM: PULL TROUGH GASTROSTOMY TUBE PLACEMENT COMPARISON:  CT abdomen pelvis - 10/23/2016 MEDICATIONS: The patient is currently admitted to the hospital and receiving intravenous antibiotics. Glucagon 1 mg IV CONTRAST:  15 mL of Isovue 300 administered into the gastric lumen. ANESTHESIA/SEDATION: Moderate (conscious) sedation was employed during this procedure. A total of Versed 1 mg and Fentanyl 50 mcg was administered intravenously. Moderate Sedation Time: 10 minutes. The patient's level of consciousness and vital signs were monitored continuously by radiology nursing throughout the procedure under my direct supervision. FLUOROSCOPY TIME:  3 minutes 48 seconds (43.3 MGy) COMPLICATIONS: None  immediate. PROCEDURE: Informed written consent was obtained from the patient following explanation of the procedure, risks, benefits and alternatives. A time out was performed prior to the initiation of the procedure. Ultrasound scanning was performed to demarcate the edge of the left lobe of the liver. Maximal barrier sterile technique utilized including caps, mask, sterile gowns, sterile gloves, large sterile drape, hand hygiene and Betadine prep. The left upper quadrant was sterilely prepped and draped. An oral gastric catheter was inserted into the stomach under fluoroscopy. The existing nasogastric feeding tube was removed. The left costal margin and air opacified transverse colon were identified and avoided. Air was injected into the stomach for insufflation and visualization under fluoroscopy. Under sterile conditions a 17 gauge trocar needle was utilized to access the stomach percutaneously beneath the left subcostal margin after the overlying soft tissues were anesthetized with 1% Lidocaine with epinephrine. Needle position was confirmed within the stomach with aspiration of air and injection of small amount of contrast. A single T tack was deployed for gastropexy. Over an Amplatz guide wire, a 9-French sheath was inserted into the stomach. A snare device was utilized to capture the oral gastric catheter. The snare device was pulled retrograde from the stomach up the esophagus and out the oropharynx. The 20-French pull-through gastrostomy was connected to the snare device and pulled antegrade through the oropharynx down the esophagus into the stomach and then through the percutaneous tract external to the patient. The gastrostomy was assembled externally. Contrast injection confirms position in the stomach. Several spot radiographic images were obtained in various obliquities for documentation. The patient tolerated procedure well without immediate post procedural complication. FINDINGS: After successful  fluoroscopic guided placement, the gastrostomy tube is appropriately positioned with internal disc against the ventral aspect of the gastric lumen. IMPRESSION: Successful fluoroscopic insertion of a 20-French pull-through gastrostomy tube. The gastrostomy may be used immediately for medication administration and in 24 hrs for the initiation of feeds. Electronically Signed   By: Simonne Come M.D.   On: 11/22/2016 16:13     Medications:   Aranesp 60 mcg q. Wednesday (3/21) Famotidine Iron sulfate tablets Midrin 5 mg twice a day miraLax daily Multivitamins     Assessment/ Plan:  83 y.o.  African American male with a PMHx of dementia, chronic kidney disease stage IV, congestive heart failure ejection fraction 25-30%, who was  admitted to [facility] on 09/30/2016 for ongoing management of cholecystitis and volvulus status post exploratory laparotomy with colectomy. Patient was previously in [place]. Dialysis started at [facility] around 10/03/2016  1.  ESRD. ATN no recovery  2.  Anasarca   3.  Acute respiratory failure., trach  4.  Metabolic acidosis. 5.  Anemia of CKD.  6.  Hyponatremia - volume overload  7.  Fungemia, R EJ catheter replaced 11/14/16, pt was kept catheter free for several days.     Plan:   2000 cc of fluid removed with hemodialysis on Wednesday Patient is due for hemodialysis today. He continues to have considerable anasarca.  We will continue the patient on hemodialysis at this time. He has required periodic albumin support as well. Continue to monitor CBC has most recent hemoglobin is now 7.3 Continue Aranesp. Dose increased to 60 mcg to be given on March 21. Phos 2.1  As before patient overall remains critically ill. He is requiring mechanical ventilation as well as dialysis. He is severely debilitated. As such his prognosis is very guarded.   LOS: 0 Jesus Vega 3/16/20189:08 AM

## 2016-11-27 DIAGNOSIS — N186 End stage renal disease: Secondary | ICD-10-CM | POA: Diagnosis not present

## 2016-11-27 DIAGNOSIS — I509 Heart failure, unspecified: Secondary | ICD-10-CM | POA: Diagnosis not present

## 2016-11-27 DIAGNOSIS — Z992 Dependence on renal dialysis: Secondary | ICD-10-CM

## 2016-11-27 LAB — CBC
HCT: 25.8 % — ABNORMAL LOW (ref 39.0–52.0)
HEMOGLOBIN: 7.8 g/dL — AB (ref 13.0–17.0)
MCH: 29.2 pg (ref 26.0–34.0)
MCHC: 30.2 g/dL (ref 30.0–36.0)
MCV: 96.6 fL (ref 78.0–100.0)
Platelets: 198 10*3/uL (ref 150–400)
RBC: 2.67 MIL/uL — ABNORMAL LOW (ref 4.22–5.81)
RDW: 21.1 % — ABNORMAL HIGH (ref 11.5–15.5)
WBC: 9.7 10*3/uL (ref 4.0–10.5)

## 2016-11-27 LAB — RENAL FUNCTION PANEL
ALBUMIN: 2.1 g/dL — AB (ref 3.5–5.0)
ANION GAP: 8 (ref 5–15)
BUN: 37 mg/dL — AB (ref 6–20)
CALCIUM: 8.2 mg/dL — AB (ref 8.9–10.3)
CO2: 27 mmol/L (ref 22–32)
Chloride: 97 mmol/L — ABNORMAL LOW (ref 101–111)
Creatinine, Ser: 3.18 mg/dL — ABNORMAL HIGH (ref 0.61–1.24)
GFR calc Af Amer: 19 mL/min — ABNORMAL LOW (ref >60)
GFR calc non Af Amer: 17 mL/min — ABNORMAL LOW (ref >60)
GLUCOSE: 116 mg/dL — AB (ref 65–99)
POTASSIUM: 3.3 mmol/L — AB (ref 3.5–5.1)
Phosphorus: 2.3 mg/dL — ABNORMAL LOW (ref 2.5–4.6)
SODIUM: 132 mmol/L — AB (ref 135–145)

## 2016-11-27 NOTE — Progress Notes (Signed)
[facility]  ROUNDING NOTE   Subjective:  Patient significantly improved since I last saw him. He completed hemodialysis today and Ultrafiltration and achieved was 3.4 kg. He is now off the ventilator.   Objective:  Vital signs in last 24 hours:  Temperature 96.5 pulse 71 respirations 23 blood pressure 114/59  Physical Exam:    General: No acute distress  Eyes: Anicteric  Neck: traceostomy capped  Lungs:  Scattered rhonchi, normal effort  Heart: S1S2 no rubs  Abdomen:  PEG in place, colostomy  Extremities: 2+ dependent edema over legs, feet in soft boot support  Neurologic: Awake, will follow simple commands  Skin: Warm & dry  Access: Right external jugular temporary dialysis catheter    Basic Metabolic Panel:  Recent Labs Lab 11/20/16 2045 11/22/16 0500 11/24/16 0609 11/27/16 0555  NA 131* 131* 131* 132*  K 3.8 3.5 3.2* 3.3*  CL 97* 97* 97* 97*  CO2 28 25 27 27   GLUCOSE 106* 122* 125* 116*  BUN 26* 32* 24* 37*  CREATININE 2.12* 2.96* 2.59* 3.18*  CALCIUM 7.8* 8.3* 8.3* 8.2*  PHOS 2.3* 3.4 2.7 2.3*    Liver Function Tests:  Recent Labs Lab 11/20/16 2045 11/22/16 0500 11/24/16 0609 11/27/16 0555  ALBUMIN 2.0* 2.4* 2.1* 2.1*   No results for input(s): LIPASE, AMYLASE in the last 168 hours. No results for input(s): AMMONIA in the last 168 hours.  CBC:  Recent Labs Lab 11/20/16 2045 11/22/16 0500 11/24/16 0609 11/27/16 0555  WBC 9.1 8.7 7.0 9.7  HGB 7.3* 7.1* 7.3* 7.8*  HCT 24.0* 22.5* 24.0* 25.8*  MCV 95.2 96.2 95.2 96.6  PLT 231 212 207 198    Cardiac Enzymes: No results for input(s): CKTOTAL, CKMB, CKMBINDEX, TROPONINI in the last 168 hours.  BNP: Invalid input(s): POCBNP  CBG: No results for input(s): GLUCAP in the last 168 hours.  Microbiology: Results for orders placed or performed during the hospital encounter of 09/30/16  Culture, blood (routine x 2)     Status: None   Collection Time: 10/06/16  4:19 PM  Result  Value Ref Range Status   Specimen Description BLOOD HEMODIALYSIS CATHETER  Final   Special Requests BOTTLES DRAWN AEROBIC ONLY 5CC  Final   Culture NO GROWTH 5 DAYS  Final   Report Status 10/11/2016 FINAL  Final  Culture, blood (routine x 2)     Status: None   Collection Time: 10/06/16  4:20 PM  Result Value Ref Range Status   Specimen Description BLOOD HEMODIALYSIS CATHETER  Final   Special Requests BOTTLES DRAWN AEROBIC ONLY 10CC  Final   Culture NO GROWTH 5 DAYS  Final   Report Status 10/11/2016 FINAL  Final  Culture, respiratory (NON-Expectorated)     Status: None   Collection Time: 10/27/16  2:03 PM  Result Value Ref Range Status   Specimen Description TRACHEAL ASPIRATE  Final   Special Requests NONE  Final   Gram Stain   Final    ABUNDANT WBC PRESENT, PREDOMINANTLY MONONUCLEAR ABUNDANT GRAM POSITIVE COCCI IN PAIRS ABUNDANT GRAM NEGATIVE COCCI IN PAIRS    Culture   Final    MODERATE METHICILLIN RESISTANT STAPHYLOCOCCUS AUREUS   Report Status 10/30/2016 FINAL  Final   Organism ID, Bacteria METHICILLIN RESISTANT STAPHYLOCOCCUS AUREUS  Final      Susceptibility   Methicillin resistant staphylococcus aureus - MIC*    CIPROFLOXACIN >=8 RESISTANT Resistant     ERYTHROMYCIN >=8 RESISTANT Resistant     GENTAMICIN <=0.5 SENSITIVE Sensitive  OXACILLIN >=4 RESISTANT Resistant     TETRACYCLINE <=1 SENSITIVE Sensitive     VANCOMYCIN 1 SENSITIVE Sensitive     TRIMETH/SULFA <=10 SENSITIVE Sensitive     CLINDAMYCIN <=0.25 SENSITIVE Sensitive     RIFAMPIN <=0.5 SENSITIVE Sensitive     Inducible Clindamycin NEGATIVE Sensitive     * MODERATE METHICILLIN RESISTANT STAPHYLOCOCCUS AUREUS  Culture, blood (routine x 2)     Status: None   Collection Time: 11/06/16  6:50 PM  Result Value Ref Range Status   Specimen Description BLOOD RIGHT HAND  Final   Special Requests IN PEDIATRIC BOTTLE 2CC  Final   Culture NO GROWTH 5 DAYS  Final   Report Status 11/11/2016 FINAL  Final  Culture, blood  (routine x 2)     Status: Abnormal   Collection Time: 11/06/16  7:00 PM  Result Value Ref Range Status   Specimen Description BLOOD LEFT HAND  Final   Special Requests BOTTLES DRAWN AEROBIC AND ANAEROBIC 5CC  Final   Culture  Setup Time   Final    BUDDING YEAST SEEN AEROBIC BOTTLE ONLY CRITICAL RESULT CALLED TO, READ BACK BY AND VERIFIED WITH: N SMITH,RN AT 1707 11/09/16 BY L BENFIELD    Culture CANDIDA SPECIES, NOT ALBICANS (A)  Final   Report Status 11/11/2016 FINAL  Final  Blood Culture ID Panel (Reflexed)     Status: None   Collection Time: 11/06/16  7:00 PM  Result Value Ref Range Status   Enterococcus species NOT DETECTED NOT DETECTED Final   Listeria monocytogenes NOT DETECTED NOT DETECTED Final   Staphylococcus species NOT DETECTED NOT DETECTED Final   Staphylococcus aureus NOT DETECTED NOT DETECTED Final   Streptococcus species NOT DETECTED NOT DETECTED Final   Streptococcus agalactiae NOT DETECTED NOT DETECTED Final   Streptococcus pneumoniae NOT DETECTED NOT DETECTED Final   Streptococcus pyogenes NOT DETECTED NOT DETECTED Final   Acinetobacter baumannii NOT DETECTED NOT DETECTED Final   Enterobacteriaceae species NOT DETECTED NOT DETECTED Final   Enterobacter cloacae complex NOT DETECTED NOT DETECTED Final   Escherichia coli NOT DETECTED NOT DETECTED Final   Klebsiella oxytoca NOT DETECTED NOT DETECTED Final   Klebsiella pneumoniae NOT DETECTED NOT DETECTED Final   Proteus species NOT DETECTED NOT DETECTED Final   Serratia marcescens NOT DETECTED NOT DETECTED Final   Haemophilus influenzae NOT DETECTED NOT DETECTED Final   Neisseria meningitidis NOT DETECTED NOT DETECTED Final   Pseudomonas aeruginosa NOT DETECTED NOT DETECTED Final   Candida albicans NOT DETECTED NOT DETECTED Final   Candida glabrata NOT DETECTED NOT DETECTED Final   Candida krusei NOT DETECTED NOT DETECTED Final   Candida parapsilosis NOT DETECTED NOT DETECTED Final   Candida tropicalis NOT DETECTED  NOT DETECTED Final  Culture, blood (routine x 2)     Status: None   Collection Time: 11/15/16  8:23 PM  Result Value Ref Range Status   Specimen Description BLOOD HEMODIALYSIS CATHETER  Final   Special Requests BOTTLES DRAWN AEROBIC AND ANAEROBIC 10CC EACH  Final   Culture NO GROWTH 5 DAYS  Final   Report Status 11/20/2016 FINAL  Final  Culture, blood (routine x 2)     Status: None   Collection Time: 11/15/16  8:27 PM  Result Value Ref Range Status   Specimen Description BLOOD HEMODIALYSIS CATHETER  Final   Special Requests BOTTLES DRAWN AEROBIC AND ANAEROBIC 10CC EACH  Final   Culture NO GROWTH 5 DAYS  Final   Report Status 11/20/2016 FINAL  Final    Coagulation Studies: No results for input(s): LABPROT, INR in the last 72 hours.  Urinalysis: No results for input(s): COLORURINE, LABSPEC, PHURINE, GLUCOSEU, HGBUR, BILIRUBINUR, KETONESUR, PROTEINUR, UROBILINOGEN, NITRITE, LEUKOCYTESUR in the last 72 hours.  Invalid input(s): APPERANCEUR    Imaging: No results found.   Medications:   Aranesp 60 mcg q. Wednesday (3/21) Famotidine Iron sulfate tablets Midrin 5 mg twice a day miraLax daily Multivitamins     Assessment/ Plan:  83 y.o.  African American male with a PMHx of dementia, chronic kidney disease stage IV, congestive heart failure ejection fraction 25-30%, who was admitted to [facility] on 09/30/2016 for ongoing management of cholecystitis and volvulus status post exploratory laparotomy with colectomy. Patient was previously in [place]. [facility]  1.  ESRD. ATN no recovery  2.  Anasarca   3.  Acute respiratory failure, trach  4.  Metabolic acidosis. 5.  Anemia of CKD.  6.  Hyponatremia - volume overload  7.  Fungemia, R EJ catheter replaced 11/14/16, pt was kept catheter free for several days.     Plan:   The patient completed hemodialysis today. He tolerated 3.4 kg of ultrafiltration. We will continue the patient  on Monday, Wednesday, Friday dialysis schedule.  Hemoglobin currently 7.8. Consider blood transfusion for hemoglobin of 7 or less. He appears to be doing well from a respiratory status and has a tracheostomy that is currently. He was able to verbalize a few words today as well. Overall some improvement noted as compared to 2 weeks ago. We will continue to monitor his status.  LOS: 0 Shahed Yeoman 3/19/20183:01 PM

## 2016-11-27 NOTE — Consult Note (Addendum)
[facility] [place] [place] CONSULT NOTE   MRN : [id number]  Reason for Consult: ESRD needs tunneled HD catheter Referring Physician: speciality hospital MD  Patient does not communicate the information is taken from his chart.  History of Present Illness: Jesus Vega is a 83 y.o. male who presents for evaluation of acute on chronic respiratory failure for placement of tracheostomy. Patient is s/p MI in December while undergoing exploratory lap for volvulus of the sigmoid colon with necrotic colon.  A colostomy was required and the patient had mental status changes post resuscitation. He's also on dialysis and has a poor prognosis. [facility] and has been intubated over 3 weeks and has been unable to wean off the ventilator.   He has a hx of dementia, acute renal failure, CKD stage IV  With CHF with EF 25-30% .  Pt had been in normal health with dementia until Dec. 2017, with sigmoid volvus with necrotic colon requiring colectomy and colostomy.   During OR pt had NSTEMI- POST  VT/VFIB on the table and hypotension and asystole.  Epinephrine was administered and pt achieved NSR.   Pt with elevated troponin in ICU he had hypokalemia and hyponatremia.  CK MB 387/4.4  TROPNIN 0.205- note of troponin of 166 but I have not seen that lab value.  May have been 0 .166.  Pt went to SNF to rehab and readmitted in Jan for AMS and SOB with EF 25-30%.  Also with rising Cr.  Also with PNA and bil. Pl effusions requiring thoracentesis.   management of cholecystitis He had cholecystostomy tube.    Family wants aggressive care.  Dialysis was ordered on the 22nd of Jan.   Pt is a full code.  Pt now needs trach.  He has severe protein calorie malnutrition.     Current Facility-Administered Medications  Medication Dose Route Frequency Provider Last Rate Last Dose  . iopamidol (ISOVUE-300) 61 % injection 75 mL  75 mL Intravenous Once PRN Carron CurieAli Hijazi, MD         Pt meds include: Statin :No Betablocker: Yes ASA: No Other anticoagulants/antiplatelets: none  Past Medical History:  Diagnosis Date  . Cardiac arrest with ventricular fibrillation (HCC) on OR table   . CHF (congestive heart failure) (HCC)   . Dementia   . Gout   . NSTEMI (non-ST elevated myocardial infarction) (HCC)   . Pancreatitis   . Renal insufficiency   . 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 COLECTOMY    . CT PERC CHOLECYSTOSTOMY    . IR GENERIC HISTORICAL  10/02/2016   IR FLUORO GUIDE CV LINE RIGHT 10/02/2016 Malachy MoanHeath McCullough, MD [facility]  . IR GENERIC HISTORICAL  10/02/2016   IR US GUIDE VASC ACCESS RIGHT 10/02/2016 Malachy MoanHeath McCullough, MD [facility]  . IR GENERIC HISTORICAL  11/14/2016   IR FLUORO GUIDE CV LINE RIGHT 11/14/2016 Richarda OverlieAdam Henn, MD [facility]  . IR GENERIC HISTORICAL  11/14/2016   IR US GUIDE VASC ACCESS RIGHT 11/14/2016 Richarda OverlieAdam Henn, MD [facility]  . IR GENERIC HISTORICAL  11/22/2016   IR GASTROSTOMY TUBE MOD SED 11/22/2016 Simonne ComeJohn Watts, MD [facility]  . TRACHEOSTOMY TUBE PLACEMENT N/A 11/06/2016   Procedure: TRACHEOSTOMY;  Surgeon: Drema Halonhristopher E Newman, MD;  [facility];  Service: ENT;  Laterality: N/A;    Social History Social History  Substance Use Topics  .  Smoking status: Never Smoker  . Smokeless tobacco: Never Used  . Alcohol use No    Family History Family History  Problem Relation Age of Onset  . Hypertension Mother     No Known Allergies   REVIEW OF SYSTEMS  Patient is not responsive   General: [ ]  Weight loss, [ ]  Fever, [ ]  chills Neurologic: [ ]  Dizziness, [ ]  Blackouts, [ ]  Seizure [ ]  Stroke, [ ]  "Mini stroke", [ ]  Slurred speech, [ ]  Temporary blindness; [ ]  weakness in arms or legs, [ ]  Hoarseness [ ]  Dysphagia Cardiac: [ ]  Chest pain/pressure, [ ]  Shortness of breath at rest [ ]  Shortness of breath with exertion, [ ]  Atrial fibrillation or  irregular heartbeat  Vascular: [ ]  Pain in legs with walking, [ ]  Pain in legs at rest, [ ]  Pain in legs at night,  [ ]  Non-healing ulcer, [ ]  Blood clot in vein/DVT,   Pulmonary: [ ]  Home oxygen, [ ]  Productive cough, [ ]  Coughing up blood, [ ]  Asthma,  [ ]  Wheezing [ ]  COPD Musculoskeletal:  [ ]  Arthritis, [ ]  Low back pain, [ ]  Joint pain Hematologic: [ ]  Easy Bruising, [ ]  Anemia; [ ]  Hepatitis Gastrointestinal: [ ]  Blood in stool, [ ]  Gastroesophageal Reflux/heartburn, Urinary: [ ]  chronic Kidney disease, [ ]  on HD - [ ]  MWF or [ ]  TTHS, [ ]  Burning with urination, [ ]  Difficulty urinating Skin: [ ]  Rashes, [ ]  Wounds Psychological: [ ]  Anxiety, [ ]  Depression  Physical Examination Vitals:   11/22/16 1528 11/22/16 1540 11/22/16 1546 11/22/16 1550  BP: 121/83 127/86 107/77 112/80  Pulse: 96 93 88 92  Resp: 19 (!) 99 12 20  SpO2: 100% (!) 27% 98% 98%   There is no height or weight on file to calculate BMI.  General: non verbal arms tightly crossed over chest  Eyes: Pupils equal Pulmonary: trach, non labored breathing Cardiac: RRR  Abdomen: soft Vascular Exam/Pulses:palpable radial, brachial and femoral arteries B Musculoskeletal: no muscle wasting or atrophy; right LE positive edema  Neurologic: unable to determine SENSATION: un determined MOTOR FUNCTION: 5/5 Symmetric Speech non responsive   Significant Diagnostic Studies: CBC Lab Results  Component Value Date   WBC 9.7 11/27/2016   HGB 7.8 (L) 11/27/2016   HCT 25.8 (L) 11/27/2016   MCV 96.6 11/27/2016   PLT 198 11/27/2016    BMET    Component Value Date/Time   NA 132 (L) 11/27/2016 0555   K 3.3 (L) 11/27/2016 0555   CL 97 (L) 11/27/2016 0555   CO2 27 11/27/2016 0555   GLUCOSE 116 (H) 11/27/2016 0555   BUN 37 (H) 11/27/2016 0555   CREATININE 3.18 (H) 11/27/2016 0555   CALCIUM 8.2 (L) 11/27/2016 0555   GFRNONAA 17 (L) 11/27/2016 0555   GFRAA 19 (L) 11/27/2016 0555   CrCl cannot be calculated (Unknown  ideal weight.).  COAG Lab Results  Component Value Date   INR 1.69 11/22/2016   INR 1.86 11/21/2016   INR 1.67 11/20/2016     Non-Invasive Vascular Imaging:   ASSESSMENT/PLAN:  Currently on tube feeding,  not responding due to ET tube but opens eyes, with trach.   Cardiac arrest with ventricular fibrillation (HCC) on OR table    . CHF (congestive heart failure) (HCC)   . Dementia   . Gout   . NSTEMI (non-ST elevated myocardial infarction) (HCC)   . Pancreatitis   . Volvulus of sigmoid colon (HCC)- necrotic  colon    ESRD will plan to place tunneled HD catheter.  We do not feel that a fistula is neccessary at this time.  Clinton Gallant [facility] 11/27/2016 2:57 PM  I have examined the patient, reviewed and agree with above.Very unfortunate gentleman with past history as outlined above. Currently with multisystem failure. Has a trach and has been weaned off the vent. Has a feeding tube. Has renal failure with a temporary IJ catheter. Does not follow commands and unable to answer any questions today. Appears as though both arms are severely contracted buttons were some persistence he can straighten his arms. He does not have any radial pulses. Does have brachial pulses.  Extremely poor candidate for long-term hemodialysis. Will not place a AV fistula or graft. Will place a tunneled catheter for intermediate term hemodialysis. If the patient survives and makes any meaningful recovery could consider arm access in the future.  Scheduled for tunneled catheter tomorrow in the operating room  Gretta Began, MD 11/27/2016 3:18 PM

## 2016-11-28 ENCOUNTER — Inpatient Hospital Stay (HOSPITAL_COMMUNITY)
Admission: AD | Admit: 2016-11-28 | Payer: Medicare Other | Source: Other Acute Inpatient Hospital | Admitting: Internal Medicine

## 2016-11-28 ENCOUNTER — Encounter
Admission: AD | Disposition: A | Discharge status: Hospice | Payer: Self-pay | Source: Other Acute Inpatient Hospital | Attending: Internal Medicine

## 2016-11-28 ENCOUNTER — Encounter (HOSPITAL_COMMUNITY): Payer: Medicare Other | Admitting: Anesthesiology

## 2016-11-28 ENCOUNTER — Encounter (HOSPITAL_COMMUNITY): Payer: Self-pay | Admitting: Anesthesiology

## 2016-11-28 ENCOUNTER — Other Ambulatory Visit (HOSPITAL_COMMUNITY): Payer: Medicare Other

## 2016-11-28 HISTORY — PX: INSERTION OF DIALYSIS CATHETER: SHX1324

## 2016-11-28 LAB — HEPATITIS B SURFACE ANTIGEN: Hepatitis B Surface Ag: NEGATIVE

## 2016-11-28 LAB — POCT I-STAT 4, (NA,K, GLUC, HGB,HCT)
GLUCOSE: 106 mg/dL — AB (ref 65–99)
HEMATOCRIT: 28 % — AB (ref 39.0–52.0)
Hemoglobin: 9.5 g/dL — ABNORMAL LOW (ref 13.0–17.0)
POTASSIUM: 3.4 mmol/L — AB (ref 3.5–5.1)
Sodium: 136 mmol/L (ref 135–145)

## 2016-11-28 LAB — GLUCOSE, CAPILLARY: GLUCOSE-CAPILLARY: 73 mg/dL (ref 65–99)

## 2016-11-28 LAB — HEPATITIS B CORE ANTIBODY, TOTAL: Hep B Core Total Ab: NEGATIVE

## 2016-11-28 LAB — HEPATITIS B SURFACE ANTIBODY,QUALITATIVE: Hep B S Ab: NONREACTIVE

## 2016-11-28 SURGERY — INSERTION OF DIALYSIS CATHETER
Anesthesia: Monitor Anesthesia Care | Site: Neck | Laterality: Left

## 2016-11-28 MED ORDER — HEPARIN SODIUM (PORCINE) 1000 UNIT/ML IJ SOLN
INTRAMUSCULAR | Status: DC | PRN
  Administered 2016-11-28: 1000 [IU]

## 2016-11-28 MED ORDER — HEPARIN SODIUM (PORCINE) 1000 UNIT/ML IJ SOLN
INTRAMUSCULAR | Status: AC
  Filled 2016-11-28: qty 1

## 2016-11-28 MED ORDER — LIDOCAINE HCL (PF) 1 % IJ SOLN
INTRAMUSCULAR | Status: DC | PRN
  Administered 2016-11-28: 30 mL via SUBCUTANEOUS

## 2016-11-28 MED ORDER — SODIUM CHLORIDE 0.9 % IV SOLN
INTRAVENOUS | Status: DC | PRN
  Administered 2016-11-28: 500 mL

## 2016-11-28 MED ORDER — CEFAZOLIN IN D5W 1 GM/50ML IV SOLN
INTRAVENOUS | Status: DC | PRN
  Administered 2016-11-28: 1 g via INTRAVENOUS

## 2016-11-28 MED ORDER — LIDOCAINE HCL (PF) 1 % IJ SOLN
INTRAMUSCULAR | Status: AC
  Filled 2016-11-28: qty 30

## 2016-11-28 MED ORDER — SODIUM CHLORIDE 0.9 % IV SOLN
INTRAVENOUS | Status: DC | PRN
  Administered 2016-11-28: 14:00:00 via INTRAVENOUS

## 2016-11-28 MED ORDER — 0.9 % SODIUM CHLORIDE (POUR BTL) OPTIME
TOPICAL | Status: DC | PRN
  Administered 2016-11-28: 1000 mL

## 2016-11-28 SURGICAL SUPPLY — 39 items
BAG DECANTER FOR FLEXI CONT (MISCELLANEOUS) ×2 IMPLANT
BIOPATCH RED 1 DISK 7.0 (GAUZE/BANDAGES/DRESSINGS) ×2 IMPLANT
CATH PALINDROME RT-P 15FX19CM (CATHETERS) IMPLANT
CATH PALINDROME RT-P 15FX23CM (CATHETERS) IMPLANT
CATH PALINDROME RT-P 15FX28CM (CATHETERS) ×2 IMPLANT
CATH PALINDROME RT-P 15FX55CM (CATHETERS) IMPLANT
CHLORAPREP W/TINT 26ML (MISCELLANEOUS) ×2 IMPLANT
COVER PROBE W GEL 5X96 (DRAPES) ×2 IMPLANT
COVER SURGICAL LIGHT HANDLE (MISCELLANEOUS) ×2 IMPLANT
DERMABOND ADVANCED (GAUZE/BANDAGES/DRESSINGS) ×1
DERMABOND ADVANCED .7 DNX12 (GAUZE/BANDAGES/DRESSINGS) ×1 IMPLANT
DRAPE C-ARM 42X72 X-RAY (DRAPES) ×2 IMPLANT
DRAPE CHEST BREAST 15X10 FENES (DRAPES) ×2 IMPLANT
DRSG COVADERM 4X6 (GAUZE/BANDAGES/DRESSINGS) ×2 IMPLANT
GAUZE SPONGE 4X4 16PLY XRAY LF (GAUZE/BANDAGES/DRESSINGS) ×2 IMPLANT
GLOVE BIO SURGEON STRL SZ7.5 (GLOVE) ×2 IMPLANT
GLOVE BIOGEL PI IND STRL 8 (GLOVE) ×1 IMPLANT
GLOVE BIOGEL PI INDICATOR 8 (GLOVE) ×1
GOWN STRL REUS W/ TWL LRG LVL3 (GOWN DISPOSABLE) ×3 IMPLANT
GOWN STRL REUS W/TWL LRG LVL3 (GOWN DISPOSABLE) ×3
HOLDER TRACH TUBE VELCRO 19.5 (MISCELLANEOUS) ×2 IMPLANT
KIT BASIN OR (CUSTOM PROCEDURE TRAY) ×2 IMPLANT
KIT ROOM TURNOVER OR (KITS) ×2 IMPLANT
NEEDLE 18GX1X1/2 (RX/OR ONLY) (NEEDLE) ×2 IMPLANT
NEEDLE 22X1 1/2 (OR ONLY) (NEEDLE) ×2 IMPLANT
NEEDLE HYPO 25GX1X1/2 BEV (NEEDLE) ×2 IMPLANT
NS IRRIG 1000ML POUR BTL (IV SOLUTION) ×2 IMPLANT
PACK SURGICAL SETUP 50X90 (CUSTOM PROCEDURE TRAY) ×2 IMPLANT
PAD ARMBOARD 7.5X6 YLW CONV (MISCELLANEOUS) ×4 IMPLANT
SUT ETHILON 3 0 PS 1 (SUTURE) ×2 IMPLANT
SUT VICRYL 4-0 PS2 18IN ABS (SUTURE) ×2 IMPLANT
SYR 10ML LL (SYRINGE) ×2 IMPLANT
SYR 20CC LL (SYRINGE) ×4 IMPLANT
SYR 5ML LL (SYRINGE) ×4 IMPLANT
SYR CONTROL 10ML LL (SYRINGE) ×2 IMPLANT
TOWEL OR 17X24 6PK STRL BLUE (TOWEL DISPOSABLE) ×2 IMPLANT
TOWEL OR 17X26 10 PK STRL BLUE (TOWEL DISPOSABLE) ×2 IMPLANT
TUBE TRACH SHILEY 6 UNCF (TUBING) ×2 IMPLANT
WATER STERILE IRR 1000ML POUR (IV SOLUTION) ×2 IMPLANT

## 2016-11-28 NOTE — H&P (View-Only) (Signed)
[facility] [place] CONSULT NOTE   MRN : [id number]  Reason for Consult: ESRD needs tunneled HD catheter Referring Physician: special[facility] hospital MD  Patient does not communicate the information is taken from his chart.  History of Present Illness: Jesus Vega is a 83 y.o. male who presents for evaluation of acute on chronic respiratory failure for placement of tracheostomy. Patient is s/p MI in December while undergoing exploratory lap for volvulus of the sigmoid colon with necrotic colon.  A colostomy was required and the patient had mental status changes post resuscitation. He's also on dialysis and has a poor prognosis. He was [facility] and has been intubated over 3 weeks and has been unable to wean off the ventilator.   He has a hx of dementia, acute renal failure, CKD stage IV  With CHF with EF 25-30% .  Pt had been in normal health with dementia until Dec. 2017, with sigmoid volvus with necrotic colon requiring colectomy and colostomy.   During OR pt had NSTEMI- POST  VT/VFIB on the table and hypotension and asystole.  Epinephrine was administered and pt achieved NSR.   Pt with elevated troponin in ICU he had hypokalemia and hyponatremia.  CK MB 387/4.4  TROPNIN 0.205- note of troponin of 166 but I have not seen that lab value.  May have been 0 .166.  Pt went to SNF to rehab and readmitted in Jan for AMS and SOB with EF 25-30%.  Also with rising Cr.  Also with PNA and bil. Pl effusions requiring thoracentesis.   management of cholecystitis He had cholecystostomy tube.    Family wants aggressive care.  Dialysis was ordered on the 22nd of Jan.   Pt is a full code.  Pt now needs trach.  He has severe protein calorie malnutrition.     Current Facility-Administered Medications  Medication Dose Route Frequency Provider Last Rate Last Dose  . iopamidol (ISOVUE-300) 61 % injection 75 mL  75 mL Intravenous Once PRN Carron CurieAli Hijazi, MD         Pt meds include: Statin :No Betablocker: Yes ASA: No Other anticoagulants/antiplatelets: none  Past Medical History:  Diagnosis Date  . Cardiac arrest with ventricular fibrillation (HCC) on OR table   . CHF (congestive heart failure) (HCC)   . Dementia   . Gout   . NSTEMI (non-ST elevated myocardial infarction) (HCC)   . Pancreatitis   . Renal insufficiency   . 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 COLECTOMY    . CT PERC CHOLECYSTOSTOMY    . IR GENERIC HISTORICAL  10/02/2016   IR FLUORO GUIDE CV LINE RIGHT 10/02/2016 Malachy MoanHeath McCullough, MD [facility]  . IR GENERIC HISTORICAL  10/02/2016   IR US GUIDE VASC ACCESS RIGHT 10/02/2016 Malachy MoanHeath McCullough, MD [facility]  . IR GENERIC HISTORICAL  11/14/2016   IR FLUORO GUIDE CV LINE RIGHT 11/14/2016 Richarda OverlieAdam Henn, MD [facility]  . IR GENERIC HISTORICAL  11/14/2016   IR US GUIDE VASC ACCESS RIGHT 11/14/2016 Richarda OverlieAdam Henn, MD [facility]  . IR GENERIC HISTORICAL  11/22/2016   IR GASTROSTOMY TUBE MOD SED 11/22/2016 Simonne ComeJohn Watts, MD [facility]  . TRACHEOSTOMY TUBE PLACEMENT N/A 11/06/2016   Procedure: TRACHEOSTOMY;  Surgeon: Drema Halonhristopher E Newman, MD;  [facility];  Service: ENT;  Laterality: N/A;    Social History Social History  Substance Use Topics  .  Smoking status: Never Smoker  . Smokeless tobacco: Never Used  . Alcohol use No    Family History Family History  Problem Relation Age of Onset  . Hypertension Mother     No Known Allergies   REVIEW OF SYSTEMS  Patient is not responsive   General: [ ]  Weight loss, [ ]  Fever, [ ]  chills Neurologic: [ ]  Dizziness, [ ]  Blackouts, [ ]  Seizure [ ]  Stroke, [ ]  "Mini stroke", [ ]  Slurred speech, [ ]  Temporary blindness; [ ]  weakness in arms or legs, [ ]  Hoarseness [ ]  Dysphagia Cardiac: [ ]  Chest pain/pressure, [ ]  Shortness of breath at rest [ ]  Shortness of breath with exertion, [ ]  Atrial fibrillation or  irregular heartbeat  Vascular: [ ]  Pain in legs with walking, [ ]  Pain in legs at rest, [ ]  Pain in legs at night,  [ ]  Non-healing ulcer, [ ]  Blood clot in vein/DVT,   Pulmonary: [ ]  Home oxygen, [ ]  Productive cough, [ ]  Coughing up blood, [ ]  Asthma,  [ ]  Wheezing [ ]  COPD Musculoskeletal:  [ ]  Arthritis, [ ]  Low back pain, [ ]  Joint pain Hematologic: [ ]  Easy Bruising, [ ]  Anemia; [ ]  Hepatitis Gastrointestinal: [ ]  Blood in stool, [ ]  Gastroesophageal Reflux/heartburn, Urinary: [ ]  chronic Kidney disease, [ ]  on HD - [ ]  MWF or [ ]  TTHS, [ ]  Burning with urination, [ ]  Difficulty urinating Skin: [ ]  Rashes, [ ]  Wounds Psychological: [ ]  Anxiety, [ ]  Depression  Physical Examination Vitals:   11/22/16 1528 11/22/16 1540 11/22/16 1546 11/22/16 1550  BP: 121/83 127/86 107/77 112/80  Pulse: 96 93 88 92  Resp: 19 (!) 99 12 20  SpO2: 100% (!) 27% 98% 98%   There is no height or weight on file to calculate BMI.  General: non verbal arms tightly crossed over chest  Eyes: Pupils equal Pulmonary: trach, non labored breathing Cardiac: RRR  Abdomen: soft Vascular Exam/Pulses:palpable radial, brachial and femoral arteries B Musculoskeletal: no muscle wasting or atrophy; right LE positive edema  Neurologic: unable to determine SENSATION: un determined MOTOR FUNCTION: 5/5 Symmetric Speech non responsive   Significant Diagnostic Studies: CBC Lab Results  Component Value Date   WBC 9.7 11/27/2016   HGB 7.8 (L) 11/27/2016   HCT 25.8 (L) 11/27/2016   MCV 96.6 11/27/2016   PLT 198 11/27/2016    BMET    Component Value Date/Time   NA 132 (L) 11/27/2016 0555   K 3.3 (L) 11/27/2016 0555   CL 97 (L) 11/27/2016 0555   CO2 27 11/27/2016 0555   GLUCOSE 116 (H) 11/27/2016 0555   BUN 37 (H) 11/27/2016 0555   CREATININE 3.18 (H) 11/27/2016 0555   CALCIUM 8.2 (L) 11/27/2016 0555   GFRNONAA 17 (L) 11/27/2016 0555   GFRAA 19 (L) 11/27/2016 0555   CrCl cannot be calculated (Unknown  ideal weight.).  COAG Lab Results  Component Value Date   INR 1.69 11/22/2016   INR 1.86 11/21/2016   INR 1.67 11/20/2016     Non-Invasive Vascular Imaging:   ASSESSMENT/PLAN:  Currently on tube feeding,  not responding due to ET tube but opens eyes, with trach.   Cardiac arrest with ventricular fibrillation (HCC) on OR table    . CHF (congestive heart failure) (HCC)   . Dementia   . Gout   . NSTEMI (non-ST elevated myocardial infarction) (HCC)   . Pancreatitis   . Volvulus of sigmoid colon (HCC)- necrotic  colon    ESRD will plan to place tunneled HD catheter.  We do not feel that a fistula is neccessary at this time.  Clinton Gallant [facility] 11/27/2016 2:57 PM  I have examined the patient, reviewed and agree with above.Very unfortunate gentleman with past history as outlined above. Currently with multisystem failure. Has a trach and has been weaned off the vent. Has a feeding tube. Has renal failure with a temporary IJ catheter. Does not follow commands and unable to answer any questions today. Appears as though both arms are severely contracted buttons were some persistence he can straighten his arms. He does not have any radial pulses. Does have brachial pulses.  Extremely poor candidate for long-term hemodialysis. Will not place a AV fistula or graft. Will place a tunneled catheter for intermediate term hemodialysis. If the patient survives and makes any meaningful recovery could consider arm access in the future.  Scheduled for tunneled catheter tomorrow in the operating room  Gretta Began, MD 11/27/2016 3:18 PM

## 2016-11-28 NOTE — Transfer of Care (Signed)
Immediate Anesthesia Transfer of Care Note  Patient: Jesus Vega  Procedure(s) Performed: Procedure(s): INSERTION OF DIALYSIS CATHETER (Left)  Patient Location: PACU  Anesthesia Type:MAC  Level of Consciousness: awake, alert , oriented and sedated  Airway & Oxygen Therapy: Patient Spontanous Breathing and Patient connected to nasal cannula oxygen  Post-op Assessment: Report given to RN, Post -op Vital signs reviewed and stable and Patient moving all extremities  Post vital signs: Reviewed and stable  Last Vitals:  Vitals:   11/22/16 1546 11/22/16 1550  BP: 107/77 112/80  Pulse: 88 92  Resp: 12 20    Last Pain: There were no vitals filed for this visit.       Complications: No apparent anesthesia complications

## 2016-11-28 NOTE — Op Note (Signed)
    NAME: Jesus Vega    MRN: [id number] DOB: [birth year]    DATE OF OPERATION: 11/28/2016  PREOP DIAGNOSIS: End-stage renal disease  POSTOP DIAGNOSIS: Same  PROCEDURE: Placement of 28 cm left IJ tunneled dialysis catheter  SURGEON: Di Kindlehristopher S. Edilia Boickson, MD, FACS  ASSIST: None  ANESTHESIA: Local with sedation   EBL: Minimal  INDICATIONS: Jesus Vega is a 83 y.o. male markedly debilitated male with a tracheostomy. He has a temporary right IJ dialysis catheter and I was asked to place a tunneled dialysis catheter  FINDINGS: Patent left IJ. Catheter in good position with excellent flow in both ports.  TECHNIQUE: The patient was taken to the operative room and sedated by anesthesia. I removed the strap from his tracheostomy to allow access to the left neck. The left IJ did appear to be patent. The neck and upper chest were prepped and draped in usual sterile fashion. Under ultrasound guidance, after the skin was anesthetized, the left IJ was cannulated and a guidewire introduced into the superior vena cava under fluoroscopic control. The tract over the wire was dilated and then a dilator and peel-away sheath were passed over the wire and the wire and dilator removed. The catheter was passed through the peel-away sheath over the wire into the right atrium and then the peel-away sheath and wire were removed. The exit site for the catheter was selected and the skin anesthetized between the 2 areas. Catheter was brought through the tunnel cut to the appropriate length and the distal ports were attached. Both ports withdrew easily and flushed with heparin saline and filled with concentrated heparin. The catheter was secured at its exit site with a 3-0 nylon suture. The IJ cannulation site was closed with a forceps to take her stitch. Dermabond was applied. Once this was dry, the trach strap was reapplied. The patient tolerated the procedure well and transferred to the recovery room in stable condition.  All needle and sponge counts were correct.  Waverly Ferrarihristopher Dickson, MD, FACS [facility]  DATE OF DICTATION:   11/28/2016

## 2016-11-28 NOTE — Interval H&P Note (Signed)
History and Physical Interval Note:  11/28/2016 12:52 PM  Dorrene GermanCole J Guidroz  has presented today for surgery, with the diagnosis of End stage renal disease N18.6  The various methods of treatment have been discussed with the patient and family. After consideration of risks, benefits and other options for treatment, the patient has consented to  Procedure(s): INSERTION OF DIALYSIS CATHETER (N/A) as a surgical intervention .  The patient's history has been reviewed, patient examined, no change in status, stable for surgery.  I have reviewed the patient's chart and labs.  Questions were answered to the patient's satisfaction.     Waverly Ferrariickson, Artez Regis

## 2016-11-28 NOTE — Anesthesia Postprocedure Evaluation (Signed)
Anesthesia Post Note  Patient: Jesus Vega  Procedure(s) Performed: Procedure(s) (LRB): INSERTION OF DIALYSIS CATHETER (Left)  Patient location during evaluation: PACU Anesthesia Type: MAC Level of consciousness: awake Pain management: pain level controlled Respiratory status: spontaneous breathing Cardiovascular status: stable Anesthetic complications: no       Last Vitals:  Vitals:   11/22/16 1546 11/22/16 1550  BP: 107/77 112/80  Pulse: 88 92  Resp: 12 20    Last Pain: There were no vitals filed for this visit.               Roopa Graver

## 2016-11-28 NOTE — Progress Notes (Signed)
22 gauge IV removed from left wrist d/t leaking around IV site.

## 2016-11-28 NOTE — Anesthesia Preprocedure Evaluation (Signed)
Anesthesia Evaluation  Patient identified by MRN, date of birth, ID band Patient awake    Reviewed: Allergy & Precautions, NPO status , Patient's Chart, lab work & pertinent test results  Airway Mallampati: II  TM Distance: >3 FB     Dental   Pulmonary neg pulmonary ROS,    breath sounds clear to auscultation       Cardiovascular + Past MI and +CHF   Rhythm:Regular Rate:Normal     Neuro/Psych    GI/Hepatic negative GI ROS, Neg liver ROS,   Endo/Other    Renal/GU Renal disease     Musculoskeletal   Abdominal   Peds  Hematology   Anesthesia Other Findings   Reproductive/Obstetrics                             Anesthesia Physical Anesthesia Plan  ASA: III  Anesthesia Plan: MAC   Post-op Pain Management:    Induction: Intravenous  Airway Management Planned: Simple Face Mask  Additional Equipment:   Intra-op Plan:   Post-operative Plan:   Informed Consent: I have reviewed the patients History and Physical, chart, labs and discussed the procedure including the risks, benefits and alternatives for the proposed anesthesia with the patient or authorized representative who has indicated his/her understanding and acceptance.   Dental advisory given  Plan Discussed with: CRNA and Anesthesiologist  Anesthesia Plan Comments:         Anesthesia Quick Evaluation

## 2016-11-29 ENCOUNTER — Encounter (HOSPITAL_COMMUNITY): Payer: Self-pay | Admitting: Vascular Surgery

## 2016-11-29 LAB — CBC
HCT: 25.2 % — ABNORMAL LOW (ref 39.0–52.0)
HEMOGLOBIN: 7.7 g/dL — AB (ref 13.0–17.0)
MCH: 29.4 pg (ref 26.0–34.0)
MCHC: 30.6 g/dL (ref 30.0–36.0)
MCV: 96.2 fL (ref 78.0–100.0)
PLATELETS: 161 10*3/uL (ref 150–400)
RBC: 2.62 MIL/uL — AB (ref 4.22–5.81)
RDW: 21.4 % — ABNORMAL HIGH (ref 11.5–15.5)
WBC: 12.9 10*3/uL — AB (ref 4.0–10.5)

## 2016-11-29 LAB — RENAL FUNCTION PANEL
ANION GAP: 11 (ref 5–15)
Albumin: 2.2 g/dL — ABNORMAL LOW (ref 3.5–5.0)
BUN: 43 mg/dL — ABNORMAL HIGH (ref 6–20)
CALCIUM: 8.4 mg/dL — AB (ref 8.9–10.3)
CHLORIDE: 95 mmol/L — AB (ref 101–111)
CO2: 26 mmol/L (ref 22–32)
CREATININE: 3.3 mg/dL — AB (ref 0.61–1.24)
GFR calc non Af Amer: 16 mL/min — ABNORMAL LOW (ref >60)
GFR, EST AFRICAN AMERICAN: 19 mL/min — AB (ref >60)
Glucose, Bld: 150 mg/dL — ABNORMAL HIGH (ref 65–99)
Phosphorus: 2.1 mg/dL — ABNORMAL LOW (ref 2.5–4.6)
Potassium: 3.3 mmol/L — ABNORMAL LOW (ref 3.5–5.1)
SODIUM: 132 mmol/L — AB (ref 135–145)

## 2016-11-29 NOTE — Progress Notes (Signed)
[facility]  ROUNDING NOTE   Subjective:  Patient seen and evaluated during hemodialysis. He appears to be tolerating this quite well. Over the past several weeks he has experienced some improvement.   Objective:  Vital signs in last 24 hours:  Temperature 97.7 pulse 88 respirations 25 blood pressure 100/55  Physical Exam:    General: No acute distress  Eyes: Anicteric  Neck: traceostomy capped  Lungs:  Scattered rhonchi, normal effort  Heart: S1S2 no rubs  Abdomen:  PEG in place, colostomy  Extremities: 1+ dependent edema, feet in soft boot support  Neurologic: Awake, will follow simple commands  Skin: Warm & dry  Access: Right external jugular temporary dialysis catheter    Basic Metabolic Panel:  Recent Labs Lab 11/24/16 0609 11/27/16 0555 11/28/16 1332 11/29/16 0646  NA 131* 132* 136 132*  K 3.2* 3.3* 3.4* 3.3*  CL 97* 97*  --  95*  CO2 27 27  --  26  GLUCOSE 125* 116* 106* 150*  BUN 24* 37*  --  43*  CREATININE 2.59* 3.18*  --  3.30*  CALCIUM 8.3* 8.2*  --  8.4*  PHOS 2.7 2.3*  --  2.1*    Liver Function Tests:  Recent Labs Lab 11/24/16 0609 11/27/16 0555 11/29/16 0646  ALBUMIN 2.1* 2.1* 2.2*   No results for input(s): LIPASE, AMYLASE in the last 168 hours. No results for input(s): AMMONIA in the last 168 hours.  CBC:  Recent Labs Lab 11/24/16 0609 11/27/16 0555 11/28/16 1332 11/29/16 0646  WBC 7.0 9.7  --  12.9*  HGB 7.3* 7.8* 9.5* 7.7*  HCT 24.0* 25.8* 28.0* 25.2*  MCV 95.2 96.6  --  96.2  PLT 207 198  --  161    Cardiac Enzymes: No results for input(s): CKTOTAL, CKMB, CKMBINDEX, TROPONINI in the last 168 hours.  BNP: Invalid input(s): POCBNP  CBG:  Recent Labs Lab 11/28/16 1222  GLUCAP 73    Microbiology: Results for orders placed or performed during the hospital encounter of 09/30/16  Culture, blood (routine x 2)     Status: None   Collection Time: 10/06/16  4:19 PM  Result Value Ref Range Status    Specimen Description BLOOD HEMODIALYSIS CATHETER  Final   Special Requests BOTTLES DRAWN AEROBIC ONLY 5CC  Final   Culture NO GROWTH 5 DAYS  Final   Report Status 10/11/2016 FINAL  Final  Culture, blood (routine x 2)     Status: None   Collection Time: 10/06/16  4:20 PM  Result Value Ref Range Status   Specimen Description BLOOD HEMODIALYSIS CATHETER  Final   Special Requests BOTTLES DRAWN AEROBIC ONLY 10CC  Final   Culture NO GROWTH 5 DAYS  Final   Report Status 10/11/2016 FINAL  Final  Culture, respiratory (NON-Expectorated)     Status: None   Collection Time: 10/27/16  2:03 PM  Result Value Ref Range Status   Specimen Description TRACHEAL ASPIRATE  Final   Special Requests NONE  Final   Gram Stain   Final    ABUNDANT WBC PRESENT, PREDOMINANTLY MONONUCLEAR ABUNDANT GRAM POSITIVE COCCI IN PAIRS ABUNDANT GRAM NEGATIVE COCCI IN PAIRS    Culture   Final    MODERATE METHICILLIN RESISTANT STAPHYLOCOCCUS AUREUS   Report Status 10/30/2016 FINAL  Final   Organism ID, Bacteria METHICILLIN RESISTANT STAPHYLOCOCCUS AUREUS  Final      Susceptibility   Methicillin resistant staphylococcus aureus - MIC*    CIPROFLOXACIN >=8 RESISTANT Resistant  ERYTHROMYCIN >=8 RESISTANT Resistant     GENTAMICIN <=0.5 SENSITIVE Sensitive     OXACILLIN >=4 RESISTANT Resistant     TETRACYCLINE <=1 SENSITIVE Sensitive     VANCOMYCIN 1 SENSITIVE Sensitive     TRIMETH/SULFA <=10 SENSITIVE Sensitive     CLINDAMYCIN <=0.25 SENSITIVE Sensitive     RIFAMPIN <=0.5 SENSITIVE Sensitive     Inducible Clindamycin NEGATIVE Sensitive     * MODERATE METHICILLIN RESISTANT STAPHYLOCOCCUS AUREUS  Culture, blood (routine x 2)     Status: None   Collection Time: 11/06/16  6:50 PM  Result Value Ref Range Status   Specimen Description BLOOD RIGHT HAND  Final   Special Requests IN PEDIATRIC BOTTLE 2CC  Final   Culture NO GROWTH 5 DAYS  Final   Report Status 11/11/2016 FINAL  Final  Culture, blood (routine x 2)     Status:  Abnormal   Collection Time: 11/06/16  7:00 PM  Result Value Ref Range Status   Specimen Description BLOOD LEFT HAND  Final   Special Requests BOTTLES DRAWN AEROBIC AND ANAEROBIC 5CC  Final   Culture  Setup Time   Final    BUDDING YEAST SEEN AEROBIC BOTTLE ONLY CRITICAL RESULT CALLED TO, READ BACK BY AND VERIFIED WITH: N SMITH,RN AT 1707 11/09/16 BY L BENFIELD    Culture CANDIDA SPECIES, NOT ALBICANS (A)  Final   Report Status 11/11/2016 FINAL  Final  Blood Culture ID Panel (Reflexed)     Status: None   Collection Time: 11/06/16  7:00 PM  Result Value Ref Range Status   Enterococcus species NOT DETECTED NOT DETECTED Final   Listeria monocytogenes NOT DETECTED NOT DETECTED Final   Staphylococcus species NOT DETECTED NOT DETECTED Final   Staphylococcus aureus NOT DETECTED NOT DETECTED Final   Streptococcus species NOT DETECTED NOT DETECTED Final   Streptococcus agalactiae NOT DETECTED NOT DETECTED Final   Streptococcus pneumoniae NOT DETECTED NOT DETECTED Final   Streptococcus pyogenes NOT DETECTED NOT DETECTED Final   Acinetobacter baumannii NOT DETECTED NOT DETECTED Final   Enterobacteriaceae species NOT DETECTED NOT DETECTED Final   Enterobacter cloacae complex NOT DETECTED NOT DETECTED Final   Escherichia coli NOT DETECTED NOT DETECTED Final   Klebsiella oxytoca NOT DETECTED NOT DETECTED Final   Klebsiella pneumoniae NOT DETECTED NOT DETECTED Final   Proteus species NOT DETECTED NOT DETECTED Final   Serratia marcescens NOT DETECTED NOT DETECTED Final   Haemophilus influenzae NOT DETECTED NOT DETECTED Final   Neisseria meningitidis NOT DETECTED NOT DETECTED Final   Pseudomonas aeruginosa NOT DETECTED NOT DETECTED Final   Candida albicans NOT DETECTED NOT DETECTED Final   Candida glabrata NOT DETECTED NOT DETECTED Final   Candida krusei NOT DETECTED NOT DETECTED Final   Candida parapsilosis NOT DETECTED NOT DETECTED Final   Candida tropicalis NOT DETECTED NOT DETECTED Final   Culture, blood (routine x 2)     Status: None   Collection Time: 11/15/16  8:23 PM  Result Value Ref Range Status   Specimen Description BLOOD HEMODIALYSIS CATHETER  Final   Special Requests BOTTLES DRAWN AEROBIC AND ANAEROBIC 10CC EACH  Final   Culture NO GROWTH 5 DAYS  Final   Report Status 11/20/2016 FINAL  Final  Culture, blood (routine x 2)     Status: None   Collection Time: 11/15/16  8:27 PM  Result Value Ref Range Status   Specimen Description BLOOD HEMODIALYSIS CATHETER  Final   Special Requests BOTTLES DRAWN AEROBIC AND ANAEROBIC 10CC EACH  Final  Culture NO GROWTH 5 DAYS  Final   Report Status 11/20/2016 FINAL  Final    Coagulation Studies: No results for input(s): LABPROT, INR in the last 72 hours.  Urinalysis: No results for input(s): COLORURINE, LABSPEC, PHURINE, GLUCOSEU, HGBUR, BILIRUBINUR, KETONESUR, PROTEINUR, UROBILINOGEN, NITRITE, LEUKOCYTESUR in the last 72 hours.  Invalid input(s): APPERANCEUR    Imaging: Dg Chest Port 1 View  Result Date: 11/28/2016 CLINICAL DATA:  Dialysis catheter insertion EXAM: PORTABLE CHEST 1 VIEW COMPARISON:  11/14/2016 FINDINGS: Cardiomegaly again noted. Central mild vascular congestion and mild interstitial edema bilaterally again noted. Probable small left pleural effusion with left basilar atelectasis or infiltrate. Stable right IJ dialysis catheter position. There is new left IJ dialysis catheter with tip in right atrium. Tracheostomy tube is unchanged in position. No pneumothorax. IMPRESSION: Central mild vascular congestion and mild interstitial edema bilaterally again noted. Probable small left pleural effusion with left basilar atelectasis or infiltrate. Stable right IJ dialysis catheter position. There is new left IJ dialysis catheter with tip in right atrium. Electronically Signed   By: Natasha Mead M.D.   On: 11/28/2016 14:55   Dg C-arm 1-60 Min-no Report  Result Date: 11/28/2016 Fluoroscopy was utilized by the requesting  physician.  No radiographic interpretation.     Medications:   Aranesp 60 mcg q. Wednesday (3/21) Famotidine Iron sulfate tablets Midrin 5 mg twice a day miraLax daily Multivitamins     Assessment/ Plan:  83 y.o.  African American male with a PMHx of dementia, chronic kidney disease stage IV, congestive heart failure ejection fraction 25-30%, who was admitted to [facility] on 09/30/2016 for ongoing management of cholecystitis and volvulus status post exploratory laparotomy with colectomy. Patient was previously in [place]. Dialysis started at [facility] around 10/03/2016  1.  ESRD. ATN no recovery  2.  Anasarca   3.  Acute respiratory failure, trach  4.  Metabolic acidosis. 5.  Anemia of CKD.  6.  Hyponatremia - volume overload  7.  Fungemia, R EJ catheter replaced 11/14/16, pt was kept catheter free for several days.     Plan:   Patient seen and evaluated during hemodialysis today. We will continue the patient on dialysis on a MWF schedule.  We will continue ultrafiltration with dialysis as possible. Hemoglobin continues to fluctuate and is currently down to 7.7. Consider blood transfusion for hemoglobin of 7 or less. We will need to consider placement of a PermCath later this week or early next week. Continue to monitor his progress.    LOS: 0 Chanell Nadeau 3/21/20183:09 PM

## 2016-12-01 LAB — RENAL FUNCTION PANEL
Albumin: 2.1 g/dL — ABNORMAL LOW (ref 3.5–5.0)
Anion gap: 9 (ref 5–15)
BUN: 49 mg/dL — AB (ref 6–20)
CALCIUM: 8.6 mg/dL — AB (ref 8.9–10.3)
CO2: 28 mmol/L (ref 22–32)
Chloride: 98 mmol/L — ABNORMAL LOW (ref 101–111)
Creatinine, Ser: 3.07 mg/dL — ABNORMAL HIGH (ref 0.61–1.24)
GFR calc Af Amer: 20 mL/min — ABNORMAL LOW (ref >60)
GFR, EST NON AFRICAN AMERICAN: 17 mL/min — AB (ref >60)
Glucose, Bld: 118 mg/dL — ABNORMAL HIGH (ref 65–99)
PHOSPHORUS: 2 mg/dL — AB (ref 2.5–4.6)
Potassium: 3.2 mmol/L — ABNORMAL LOW (ref 3.5–5.1)
SODIUM: 135 mmol/L (ref 135–145)

## 2016-12-01 LAB — CBC
HCT: 25.5 % — ABNORMAL LOW (ref 39.0–52.0)
HEMOGLOBIN: 7.8 g/dL — AB (ref 13.0–17.0)
MCH: 29.5 pg (ref 26.0–34.0)
MCHC: 30.6 g/dL (ref 30.0–36.0)
MCV: 96.6 fL (ref 78.0–100.0)
PLATELETS: 135 10*3/uL — AB (ref 150–400)
RBC: 2.64 MIL/uL — AB (ref 4.22–5.81)
RDW: 21.1 % — AB (ref 11.5–15.5)
WBC: 16.4 10*3/uL — ABNORMAL HIGH (ref 4.0–10.5)

## 2016-12-01 NOTE — Progress Notes (Signed)
[facility]  ROUNDING NOTE   Subjective:  Patient seen and evaluated at bedside. He is due for hemodialysis today. Tracheostomy remains capped at the moment. Hemoglobin was down to 7.7 at last check.   Objective:  Vital signs in last 24 hours:  Temperature 97.6 pulse 80 respirations 20 blood pressure 97/62  Physical Exam:    General: No acute distress  Eyes: Anicteric  Neck: traceostomy capped  Lungs:  Scattered rhonchi, normal effort  Heart: S1S2 no rubs  Abdomen:  PEG in place, colostomy  Extremities: 1+ dependent edema, feet in soft boot support  Neurologic: Resting comfortably in bed  Skin: Warm & dry  Access: Right external jugular temporary dialysis catheter    Basic Metabolic Panel:  Recent Labs Lab 11/27/16 0555 11/28/16 1332 11/29/16 0646  NA 132* 136 132*  K 3.3* 3.4* 3.3*  CL 97*  --  95*  CO2 27  --  26  GLUCOSE 116* 106* 150*  BUN 37*  --  43*  CREATININE 3.18*  --  3.30*  CALCIUM 8.2*  --  8.4*  PHOS 2.3*  --  2.1*    Liver Function Tests:  Recent Labs Lab 11/27/16 0555 11/29/16 0646  ALBUMIN 2.1* 2.2*   No results for input(s): LIPASE, AMYLASE in the last 168 hours. No results for input(s): AMMONIA in the last 168 hours.  CBC:  Recent Labs Lab 11/27/16 0555 11/28/16 1332 11/29/16 0646  WBC 9.7  --  12.9*  HGB 7.8* 9.5* 7.7*  HCT 25.8* 28.0* 25.2*  MCV 96.6  --  96.2  PLT 198  --  161    Cardiac Enzymes: No results for input(s): CKTOTAL, CKMB, CKMBINDEX, TROPONINI in the last 168 hours.  BNP: Invalid input(s): POCBNP  CBG:  Recent Labs Lab 11/28/16 1222  GLUCAP 73    Microbiology: Results for orders placed or performed during the hospital encounter of 09/30/16  Culture, blood (routine x 2)     Status: None   Collection Time: 10/06/16  4:19 PM  Result Value Ref Range Status   Specimen Description BLOOD HEMODIALYSIS CATHETER  Final   Special Requests BOTTLES DRAWN AEROBIC ONLY 5CC  Final   Culture NO  GROWTH 5 DAYS  Final   Report Status 10/11/2016 FINAL  Final  Culture, blood (routine x 2)     Status: None   Collection Time: 10/06/16  4:20 PM  Result Value Ref Range Status   Specimen Description BLOOD HEMODIALYSIS CATHETER  Final   Special Requests BOTTLES DRAWN AEROBIC ONLY 10CC  Final   Culture NO GROWTH 5 DAYS  Final   Report Status 10/11/2016 FINAL  Final  Culture, respiratory (NON-Expectorated)     Status: None   Collection Time: 10/27/16  2:03 PM  Result Value Ref Range Status   Specimen Description TRACHEAL ASPIRATE  Final   Special Requests NONE  Final   Gram Stain   Final    ABUNDANT WBC PRESENT, PREDOMINANTLY MONONUCLEAR ABUNDANT GRAM POSITIVE COCCI IN PAIRS ABUNDANT GRAM NEGATIVE COCCI IN PAIRS    Culture   Final    MODERATE METHICILLIN RESISTANT STAPHYLOCOCCUS AUREUS   Report Status 10/30/2016 FINAL  Final   Organism ID, Bacteria METHICILLIN RESISTANT STAPHYLOCOCCUS AUREUS  Final      Susceptibility   Methicillin resistant staphylococcus aureus - MIC*    CIPROFLOXACIN >=8 RESISTANT Resistant     ERYTHROMYCIN >=8 RESISTANT Resistant     GENTAMICIN <=0.5 SENSITIVE Sensitive     OXACILLIN >=4 RESISTANT Resistant  TETRACYCLINE <=1 SENSITIVE Sensitive     VANCOMYCIN 1 SENSITIVE Sensitive     TRIMETH/SULFA <=10 SENSITIVE Sensitive     CLINDAMYCIN <=0.25 SENSITIVE Sensitive     RIFAMPIN <=0.5 SENSITIVE Sensitive     Inducible Clindamycin NEGATIVE Sensitive     * MODERATE METHICILLIN RESISTANT STAPHYLOCOCCUS AUREUS  Culture, blood (routine x 2)     Status: None   Collection Time: 11/06/16  6:50 PM  Result Value Ref Range Status   Specimen Description BLOOD RIGHT HAND  Final   Special Requests IN PEDIATRIC BOTTLE 2CC  Final   Culture NO GROWTH 5 DAYS  Final   Report Status 11/11/2016 FINAL  Final  Culture, blood (routine x 2)     Status: Abnormal   Collection Time: 11/06/16  7:00 PM  Result Value Ref Range Status   Specimen Description BLOOD LEFT HAND  Final    Special Requests BOTTLES DRAWN AEROBIC AND ANAEROBIC 5CC  Final   Culture  Setup Time   Final    BUDDING YEAST SEEN AEROBIC BOTTLE ONLY CRITICAL RESULT CALLED TO, READ BACK BY AND VERIFIED WITH: N SMITH,RN AT 1707 11/09/16 BY L BENFIELD    Culture CANDIDA SPECIES, NOT ALBICANS (A)  Final   Report Status 11/11/2016 FINAL  Final  Blood Culture ID Panel (Reflexed)     Status: None   Collection Time: 11/06/16  7:00 PM  Result Value Ref Range Status   Enterococcus species NOT DETECTED NOT DETECTED Final   Listeria monocytogenes NOT DETECTED NOT DETECTED Final   Staphylococcus species NOT DETECTED NOT DETECTED Final   Staphylococcus aureus NOT DETECTED NOT DETECTED Final   Streptococcus species NOT DETECTED NOT DETECTED Final   Streptococcus agalactiae NOT DETECTED NOT DETECTED Final   Streptococcus pneumoniae NOT DETECTED NOT DETECTED Final   Streptococcus pyogenes NOT DETECTED NOT DETECTED Final   Acinetobacter baumannii NOT DETECTED NOT DETECTED Final   Enterobacteriaceae species NOT DETECTED NOT DETECTED Final   Enterobacter cloacae complex NOT DETECTED NOT DETECTED Final   Escherichia coli NOT DETECTED NOT DETECTED Final   Klebsiella oxytoca NOT DETECTED NOT DETECTED Final   Klebsiella pneumoniae NOT DETECTED NOT DETECTED Final   Proteus species NOT DETECTED NOT DETECTED Final   Serratia marcescens NOT DETECTED NOT DETECTED Final   Haemophilus influenzae NOT DETECTED NOT DETECTED Final   Neisseria meningitidis NOT DETECTED NOT DETECTED Final   Pseudomonas aeruginosa NOT DETECTED NOT DETECTED Final   Candida albicans NOT DETECTED NOT DETECTED Final   Candida glabrata NOT DETECTED NOT DETECTED Final   Candida krusei NOT DETECTED NOT DETECTED Final   Candida parapsilosis NOT DETECTED NOT DETECTED Final   Candida tropicalis NOT DETECTED NOT DETECTED Final  Culture, blood (routine x 2)     Status: None   Collection Time: 11/15/16  8:23 PM  Result Value Ref Range Status   Specimen  Description BLOOD HEMODIALYSIS CATHETER  Final   Special Requests BOTTLES DRAWN AEROBIC AND ANAEROBIC 10CC EACH  Final   Culture NO GROWTH 5 DAYS  Final   Report Status 11/20/2016 FINAL  Final  Culture, blood (routine x 2)     Status: None   Collection Time: 11/15/16  8:27 PM  Result Value Ref Range Status   Specimen Description BLOOD HEMODIALYSIS CATHETER  Final   Special Requests BOTTLES DRAWN AEROBIC AND ANAEROBIC 10CC EACH  Final   Culture NO GROWTH 5 DAYS  Final   Report Status 11/20/2016 FINAL  Final    Coagulation Studies: No results  for input(s): LABPROT, INR in the last 72 hours.  Urinalysis: No results for input(s): COLORURINE, LABSPEC, PHURINE, GLUCOSEU, HGBUR, BILIRUBINUR, KETONESUR, PROTEINUR, UROBILINOGEN, NITRITE, LEUKOCYTESUR in the last 72 hours.  Invalid input(s): APPERANCEUR    Imaging: No results found.   Medications:   Aranesp 60 mcg q. Wednesday (3/21) Famotidine Iron sulfate tablets Midrin 5 mg twice a day miraLax daily Multivitamins     Assessment/ Plan:  83 y.o.  African American male with a PMHx of dementia, chronic kidney disease stage IV, congestive heart failure ejection fraction 25-30%, who was admitted to [facility] on 09/30/2016 for ongoing management of cholecystitis and volvulus status post exploratory laparotomy with colectomy. Patient was previously in [place]. Dialysis started at [facility] around 10/03/2016  1.  ESRD. ATN no recovery  2.  Anasarca   3.  Acute respiratory failure, trach is capped. 4.  Metabolic acidosis. 5.  Anemia of CKD. Hgb down to 7.7, on aranesp 6.  Hyponatremia - volume overload, Na 132 7.  Fungemia, R EJ catheter replaced 11/14/16, pt was kept catheter free for several days.     Plan:   Patient due for hemodialysis today. Orders have been prepared. We will plan for dialysis on Monday thereafter. From a respiratory perspective he continues to be doing well. His tracheostomy is capped. He appears to  be breathing comfortably. Hemoglobin did drop down to 7.7. He remains on Aranesp. Consider blood transfusion for hemoglobin of 7 or less. Otherwise management of anemia as per hospitalist. We will continue to monitor CBC as well as renal function panel.    LOS: 0 Jesus Vega 3/23/20188:09 AM

## 2016-12-03 ENCOUNTER — Other Ambulatory Visit (HOSPITAL_COMMUNITY): Payer: Medicare Other

## 2016-12-04 LAB — CBC WITH DIFFERENTIAL/PLATELET
BASOS ABS: 0 10*3/uL (ref 0.0–0.1)
Basophils Relative: 0 %
EOS PCT: 0 %
Eosinophils Absolute: 0 10*3/uL (ref 0.0–0.7)
HCT: 24.2 % — ABNORMAL LOW (ref 39.0–52.0)
HEMOGLOBIN: 7.5 g/dL — AB (ref 13.0–17.0)
LYMPHS PCT: 4 %
Lymphs Abs: 0.7 10*3/uL (ref 0.7–4.0)
MCH: 29.1 pg (ref 26.0–34.0)
MCHC: 31 g/dL (ref 30.0–36.0)
MCV: 93.8 fL (ref 78.0–100.0)
Monocytes Absolute: 0.6 10*3/uL (ref 0.1–1.0)
Monocytes Relative: 3 %
NEUTROS PCT: 93 %
Neutro Abs: 17.4 10*3/uL — ABNORMAL HIGH (ref 1.7–7.7)
PLATELETS: 139 10*3/uL — AB (ref 150–400)
RBC: 2.58 MIL/uL — AB (ref 4.22–5.81)
RDW: 19.7 % — ABNORMAL HIGH (ref 11.5–15.5)
WBC: 18.8 10*3/uL — AB (ref 4.0–10.5)

## 2016-12-04 LAB — HEPATIC FUNCTION PANEL
ALK PHOS: 83 U/L (ref 38–126)
ALT: 10 U/L — AB (ref 17–63)
AST: 59 U/L — AB (ref 15–41)
Albumin: 2.3 g/dL — ABNORMAL LOW (ref 3.5–5.0)
BILIRUBIN DIRECT: 0.2 mg/dL (ref 0.1–0.5)
Indirect Bilirubin: 0.4 mg/dL (ref 0.3–0.9)
TOTAL PROTEIN: 8.7 g/dL — AB (ref 6.5–8.1)
Total Bilirubin: 0.6 mg/dL (ref 0.3–1.2)

## 2016-12-04 LAB — RENAL FUNCTION PANEL
ALBUMIN: 2.3 g/dL — AB (ref 3.5–5.0)
ANION GAP: 15 (ref 5–15)
BUN: 78 mg/dL — AB (ref 6–20)
CO2: 24 mmol/L (ref 22–32)
Calcium: 8.9 mg/dL (ref 8.9–10.3)
Chloride: 93 mmol/L — ABNORMAL LOW (ref 101–111)
Creatinine, Ser: 3.65 mg/dL — ABNORMAL HIGH (ref 0.61–1.24)
GFR, EST AFRICAN AMERICAN: 16 mL/min — AB (ref >60)
GFR, EST NON AFRICAN AMERICAN: 14 mL/min — AB (ref >60)
Glucose, Bld: 180 mg/dL — ABNORMAL HIGH (ref 65–99)
PHOSPHORUS: 2.3 mg/dL — AB (ref 2.5–4.6)
POTASSIUM: 3.7 mmol/L (ref 3.5–5.1)
Sodium: 132 mmol/L — ABNORMAL LOW (ref 135–145)

## 2016-12-04 LAB — MAGNESIUM: Magnesium: 2.2 mg/dL (ref 1.7–2.4)

## 2016-12-04 NOTE — Progress Notes (Signed)
[facility]  ROUNDING NOTE   Subjective:  Patient seen and evaluated during hemodialysis. BUN currently 70 with a creatinine of 3.6. Hemoglobin currently 7.5.   Objective:  Vital signs in last 24 hours:  Temperature 97.4 pulse 83 respirations 16 blood pressure 113/70  Physical Exam:    General: No acute distress  Eyes: Anicteric  Neck: traceostomy capped  Lungs:  Scattered rhonchi, normal effort  Heart: S1S2 no rubs  Abdomen:  PEG in place, colostomy  Extremities: 1+ dependent edema, feet in soft boot support  Neurologic: Awake but not following commands  Skin: Warm & dry  Access: Left internal jugular permcath    Basic Metabolic Panel:  Recent Labs Lab 11/28/16 1332 11/29/16 0646 12/01/16 1110 12/04/16 0803  NA 136 132* 135 132*  K 3.4* 3.3* 3.2* 3.7  CL  --  95* 98* 93*  CO2  --  26 28 24   GLUCOSE 106* 150* 118* 180*  BUN  --  43* 49* 78*  CREATININE  --  3.30* 3.07* 3.65*  CALCIUM  --  8.4* 8.6* 8.9  MG  --   --   --  2.2  PHOS  --  2.1* 2.0* 2.3*    Liver Function Tests:  Recent Labs Lab 11/29/16 0646 12/01/16 1110 12/04/16 0803  AST  --   --  59*  ALT  --   --  10*  ALKPHOS  --   --  83  BILITOT  --   --  0.6  PROT  --   --  8.7*  ALBUMIN 2.2* 2.1* 2.3*  2.3*   No results for input(s): LIPASE, AMYLASE in the last 168 hours. No results for input(s): AMMONIA in the last 168 hours.  CBC:  Recent Labs Lab 11/28/16 1332 11/29/16 0646 12/01/16 1110 12/04/16 0803  WBC  --  12.9* 16.4* 18.8*  NEUTROABS  --   --   --  17.4*  HGB 9.5* 7.7* 7.8* 7.5*  HCT 28.0* 25.2* 25.5* 24.2*  MCV  --  96.2 96.6 93.8  PLT  --  161 135* 139*    Cardiac Enzymes: No results for input(s): CKTOTAL, CKMB, CKMBINDEX, TROPONINI in the last 168 hours.  BNP: Invalid input(s): POCBNP  CBG:  Recent Labs Lab 11/28/16 1222  GLUCAP 73    Microbiology: Results for orders placed or performed during the hospital encounter of 09/30/16  Culture,  blood (routine x 2)     Status: None   Collection Time: 10/06/16  4:19 PM  Result Value Ref Range Status   Specimen Description BLOOD HEMODIALYSIS CATHETER  Final   Special Requests BOTTLES DRAWN AEROBIC ONLY 5CC  Final   Culture NO GROWTH 5 DAYS  Final   Report Status 10/11/2016 FINAL  Final  Culture, blood (routine x 2)     Status: None   Collection Time: 10/06/16  4:20 PM  Result Value Ref Range Status   Specimen Description BLOOD HEMODIALYSIS CATHETER  Final   Special Requests BOTTLES DRAWN AEROBIC ONLY 10CC  Final   Culture NO GROWTH 5 DAYS  Final   Report Status 10/11/2016 FINAL  Final  Culture, respiratory (NON-Expectorated)     Status: None   Collection Time: 10/27/16  2:03 PM  Result Value Ref Range Status   Specimen Description TRACHEAL ASPIRATE  Final   Special Requests NONE  Final   Gram Stain   Final    ABUNDANT WBC PRESENT, PREDOMINANTLY MONONUCLEAR ABUNDANT GRAM POSITIVE COCCI IN PAIRS ABUNDANT GRAM NEGATIVE  COCCI IN PAIRS    Culture   Final    MODERATE METHICILLIN RESISTANT STAPHYLOCOCCUS AUREUS   Report Status 10/30/2016 FINAL  Final   Organism ID, Bacteria METHICILLIN RESISTANT STAPHYLOCOCCUS AUREUS  Final      Susceptibility   Methicillin resistant staphylococcus aureus - MIC*    CIPROFLOXACIN >=8 RESISTANT Resistant     ERYTHROMYCIN >=8 RESISTANT Resistant     GENTAMICIN <=0.5 SENSITIVE Sensitive     OXACILLIN >=4 RESISTANT Resistant     TETRACYCLINE <=1 SENSITIVE Sensitive     VANCOMYCIN 1 SENSITIVE Sensitive     TRIMETH/SULFA <=10 SENSITIVE Sensitive     CLINDAMYCIN <=0.25 SENSITIVE Sensitive     RIFAMPIN <=0.5 SENSITIVE Sensitive     Inducible Clindamycin NEGATIVE Sensitive     * MODERATE METHICILLIN RESISTANT STAPHYLOCOCCUS AUREUS  Culture, blood (routine x 2)     Status: None   Collection Time: 11/06/16  6:50 PM  Result Value Ref Range Status   Specimen Description BLOOD RIGHT HAND  Final   Special Requests IN PEDIATRIC BOTTLE 2CC  Final    Culture NO GROWTH 5 DAYS  Final   Report Status 11/11/2016 FINAL  Final  Culture, blood (routine x 2)     Status: Abnormal   Collection Time: 11/06/16  7:00 PM  Result Value Ref Range Status   Specimen Description BLOOD LEFT HAND  Final   Special Requests BOTTLES DRAWN AEROBIC AND ANAEROBIC 5CC  Final   Culture  Setup Time   Final    BUDDING YEAST SEEN AEROBIC BOTTLE ONLY CRITICAL RESULT CALLED TO, READ BACK BY AND VERIFIED WITH: N SMITH,RN AT 1707 11/09/16 BY L BENFIELD    Culture CANDIDA SPECIES, NOT ALBICANS (A)  Final   Report Status 11/11/2016 FINAL  Final  Blood Culture ID Panel (Reflexed)     Status: None   Collection Time: 11/06/16  7:00 PM  Result Value Ref Range Status   Enterococcus species NOT DETECTED NOT DETECTED Final   Listeria monocytogenes NOT DETECTED NOT DETECTED Final   Staphylococcus species NOT DETECTED NOT DETECTED Final   Staphylococcus aureus NOT DETECTED NOT DETECTED Final   Streptococcus species NOT DETECTED NOT DETECTED Final   Streptococcus agalactiae NOT DETECTED NOT DETECTED Final   Streptococcus pneumoniae NOT DETECTED NOT DETECTED Final   Streptococcus pyogenes NOT DETECTED NOT DETECTED Final   Acinetobacter baumannii NOT DETECTED NOT DETECTED Final   Enterobacteriaceae species NOT DETECTED NOT DETECTED Final   Enterobacter cloacae complex NOT DETECTED NOT DETECTED Final   Escherichia coli NOT DETECTED NOT DETECTED Final   Klebsiella oxytoca NOT DETECTED NOT DETECTED Final   Klebsiella pneumoniae NOT DETECTED NOT DETECTED Final   Proteus species NOT DETECTED NOT DETECTED Final   Serratia marcescens NOT DETECTED NOT DETECTED Final   Haemophilus influenzae NOT DETECTED NOT DETECTED Final   Neisseria meningitidis NOT DETECTED NOT DETECTED Final   Pseudomonas aeruginosa NOT DETECTED NOT DETECTED Final   Candida albicans NOT DETECTED NOT DETECTED Final   Candida glabrata NOT DETECTED NOT DETECTED Final   Candida krusei NOT DETECTED NOT DETECTED Final    Candida parapsilosis NOT DETECTED NOT DETECTED Final   Candida tropicalis NOT DETECTED NOT DETECTED Final  Culture, blood (routine x 2)     Status: None   Collection Time: 11/15/16  8:23 PM  Result Value Ref Range Status   Specimen Description BLOOD HEMODIALYSIS CATHETER  Final   Special Requests BOTTLES DRAWN AEROBIC AND ANAEROBIC 10CC EACH  Final   Culture  NO GROWTH 5 DAYS  Final   Report Status 11/20/2016 FINAL  Final  Culture, blood (routine x 2)     Status: None   Collection Time: 11/15/16  8:27 PM  Result Value Ref Range Status   Specimen Description BLOOD HEMODIALYSIS CATHETER  Final   Special Requests BOTTLES DRAWN AEROBIC AND ANAEROBIC 10CC EACH  Final   Culture NO GROWTH 5 DAYS  Final   Report Status 11/20/2016 FINAL  Final    Coagulation Studies: No results for input(s): LABPROT, INR in the last 72 hours.  Urinalysis: No results for input(s): COLORURINE, LABSPEC, PHURINE, GLUCOSEU, HGBUR, BILIRUBINUR, KETONESUR, PROTEINUR, UROBILINOGEN, NITRITE, LEUKOCYTESUR in the last 72 hours.  Invalid input(s): APPERANCEUR    Imaging: Dg Chest Port 1 View  Result Date: 12/03/2016 CLINICAL DATA:  Wheezing EXAM: PORTABLE CHEST 1 VIEW COMPARISON:  11/28/2016 FINDINGS: Cardiomegaly again noted. There is worsening in aeration with central vascular congestion and bilateral diffuse airspace disease right greater than left highly suspicious for pulmonary edema. Probable small bilateral pleural effusion with bilateral basilar atelectasis or infiltrate. Stable left dialysis catheter position. Stable tracheostomy tube position. IMPRESSION: There is worsening in aeration with central vascular congestion and bilateral diffuse airspace disease right greater than left highly suspicious for pulmonary edema. Probable small bilateral pleural effusion with bilateral basilar atelectasis or infiltrate. Electronically Signed   By: Natasha Mead M.D.   On: 12/03/2016 15:30     Medications:   Aranesp 60  mcg q. Wednesday (3/21) Famotidine Iron sulfate tablets Midrin 5 mg twice a day miraLax daily Multivitamins     Assessment/ Plan:  83 y.o.  African American male with a PMHx of dementia, chronic kidney disease stage IV, congestive heart failure ejection fraction 25-30%, who was admitted to [facility] on 09/30/2016 for ongoing management of cholecystitis and volvulus status post exploratory laparotomy with colectomy. Patient was previously in [place]. Dialysis started at [facility] around 10/03/2016  1.  ESRD. ATN no recovery  2.  Anasarca   3.  Acute respiratory failure, trach is capped. 4.  Metabolic acidosis. 5.  Anemia of CKD. Hgb down to 7.7, on aranesp 6.  Hyponatremia - volume overload, Na 132 7.  Fungemia, L IJ permcath now in place     Plan:   Patient seen and evaluated during hemodialysis. He appears to be tolerating this well. We will continue dialysis on MWF schedule. Continue ultrafiltration at dialysis. He's had significant volume removal with dialysis. He continues to do well from the perspective of his acute respiratory failure and his tracheostomy remains capped.      LOS: 0 Leontyne Manville 3/26/20183:46 PM

## 2016-12-06 LAB — RENAL FUNCTION PANEL
ALBUMIN: 2.3 g/dL — AB (ref 3.5–5.0)
ANION GAP: 13 (ref 5–15)
BUN: 81 mg/dL — ABNORMAL HIGH (ref 6–20)
CO2: 24 mmol/L (ref 22–32)
Calcium: 8.6 mg/dL — ABNORMAL LOW (ref 8.9–10.3)
Chloride: 96 mmol/L — ABNORMAL LOW (ref 101–111)
Creatinine, Ser: 2.63 mg/dL — ABNORMAL HIGH (ref 0.61–1.24)
GFR calc Af Amer: 24 mL/min — ABNORMAL LOW (ref >60)
GFR, EST NON AFRICAN AMERICAN: 21 mL/min — AB (ref >60)
Glucose, Bld: 209 mg/dL — ABNORMAL HIGH (ref 65–99)
POTASSIUM: 3.3 mmol/L — AB (ref 3.5–5.1)
Phosphorus: 2.5 mg/dL (ref 2.5–4.6)
SODIUM: 133 mmol/L — AB (ref 135–145)

## 2016-12-06 LAB — CBC
HCT: 23.1 % — ABNORMAL LOW (ref 39.0–52.0)
Hemoglobin: 7.2 g/dL — ABNORMAL LOW (ref 13.0–17.0)
MCH: 28.5 pg (ref 26.0–34.0)
MCHC: 31.2 g/dL (ref 30.0–36.0)
MCV: 91.3 fL (ref 78.0–100.0)
PLATELETS: 159 10*3/uL (ref 150–400)
RBC: 2.53 MIL/uL — ABNORMAL LOW (ref 4.22–5.81)
RDW: 19.1 % — ABNORMAL HIGH (ref 11.5–15.5)
WBC: 15.2 10*3/uL — ABNORMAL HIGH (ref 4.0–10.5)

## 2016-12-06 NOTE — Progress Notes (Signed)
[facility]  ROUNDING NOTE   Subjective:  Patient completed hemodialysis today. Ultrafiltration achieved was 1.5 kg. Resting currently in bed.   Objective:  Vital signs in last 24 hours:  temperature 97.2 pulse 87 respirations 14 blood pressure 107/67  Physical Exam:    General: No acute distress  Eyes: Anicteric  Neck: traceostomy capped  Lungs:  Scattered rhonchi, normal effort  Heart: S1S2 no rubs  Abdomen:  PEG in place, colostomy  Extremities: trace dependent edema, feet in soft boot support  Neurologic: Awake but not following commands  Skin: Warm & dry  Access: Left internal jugular permcath    Basic Metabolic Panel:  Recent Labs Lab 12/01/16 1110 12/04/16 0803 12/06/16 0730  NA 135 132* 133*  K 3.2* 3.7 3.3*  CL 98* 93* 96*  CO2 28 24 24   GLUCOSE 118* 180* 209*  BUN 49* 78* 81*  CREATININE 3.07* 3.65* 2.63*  CALCIUM 8.6* 8.9 8.6*  MG  --  2.2  --   PHOS 2.0* 2.3* 2.5    Liver Function Tests:  Recent Labs Lab 12/01/16 1110 12/04/16 0803 12/06/16 0730  AST  --  59*  --   ALT  --  10*  --   ALKPHOS  --  83  --   BILITOT  --  0.6  --   PROT  --  8.7*  --   ALBUMIN 2.1* 2.3*  2.3* 2.3*   No results for input(s): LIPASE, AMYLASE in the last 168 hours. No results for input(s): AMMONIA in the last 168 hours.  CBC:  Recent Labs Lab 12/01/16 1110 12/04/16 0803 12/06/16 0730  WBC 16.4* 18.8* 15.2*  NEUTROABS  --  17.4*  --   HGB 7.8* 7.5* 7.2*  HCT 25.5* 24.2* 23.1*  MCV 96.6 93.8 91.3  PLT 135* 139* 159    Cardiac Enzymes: No results for input(s): CKTOTAL, CKMB, CKMBINDEX, TROPONINI in the last 168 hours.  BNP: Invalid input(s): POCBNP  CBG: No results for input(s): GLUCAP in the last 168 hours.  Microbiology: Results for orders placed or performed during the hospital encounter of 09/30/16  Culture, blood (routine x 2)     Status: None   Collection Time: 10/06/16  4:19 PM  Result Value Ref Range Status   Specimen  Description BLOOD HEMODIALYSIS CATHETER  Final   Special Requests BOTTLES DRAWN AEROBIC ONLY 5CC  Final   Culture NO GROWTH 5 DAYS  Final   Report Status 10/11/2016 FINAL  Final  Culture, blood (routine x 2)     Status: None   Collection Time: 10/06/16  4:20 PM  Result Value Ref Range Status   Specimen Description BLOOD HEMODIALYSIS CATHETER  Final   Special Requests BOTTLES DRAWN AEROBIC ONLY 10CC  Final   Culture NO GROWTH 5 DAYS  Final   Report Status 10/11/2016 FINAL  Final  Culture, respiratory (NON-Expectorated)     Status: None   Collection Time: 10/27/16  2:03 PM  Result Value Ref Range Status   Specimen Description TRACHEAL ASPIRATE  Final   Special Requests NONE  Final   Gram Stain   Final    ABUNDANT WBC PRESENT, PREDOMINANTLY MONONUCLEAR ABUNDANT GRAM POSITIVE COCCI IN PAIRS ABUNDANT GRAM NEGATIVE COCCI IN PAIRS    Culture   Final    MODERATE METHICILLIN RESISTANT STAPHYLOCOCCUS AUREUS   Report Status 10/30/2016 FINAL  Final   Organism ID, Bacteria METHICILLIN RESISTANT STAPHYLOCOCCUS AUREUS  Final      Susceptibility   Methicillin resistant  staphylococcus aureus - MIC*    CIPROFLOXACIN >=8 RESISTANT Resistant     ERYTHROMYCIN >=8 RESISTANT Resistant     GENTAMICIN <=0.5 SENSITIVE Sensitive     OXACILLIN >=4 RESISTANT Resistant     TETRACYCLINE <=1 SENSITIVE Sensitive     VANCOMYCIN 1 SENSITIVE Sensitive     TRIMETH/SULFA <=10 SENSITIVE Sensitive     CLINDAMYCIN <=0.25 SENSITIVE Sensitive     RIFAMPIN <=0.5 SENSITIVE Sensitive     Inducible Clindamycin NEGATIVE Sensitive     * MODERATE METHICILLIN RESISTANT STAPHYLOCOCCUS AUREUS  Culture, blood (routine x 2)     Status: None   Collection Time: 11/06/16  6:50 PM  Result Value Ref Range Status   Specimen Description BLOOD RIGHT HAND  Final   Special Requests IN PEDIATRIC BOTTLE 2CC  Final   Culture NO GROWTH 5 DAYS  Final   Report Status 11/11/2016 FINAL  Final  Culture, blood (routine x 2)     Status: Abnormal    Collection Time: 11/06/16  7:00 PM  Result Value Ref Range Status   Specimen Description BLOOD LEFT HAND  Final   Special Requests BOTTLES DRAWN AEROBIC AND ANAEROBIC 5CC  Final   Culture  Setup Time   Final    BUDDING YEAST SEEN AEROBIC BOTTLE ONLY CRITICAL RESULT CALLED TO, READ BACK BY AND VERIFIED WITH: N SMITH,RN AT 1707 11/09/16 BY L BENFIELD    Culture CANDIDA SPECIES, NOT ALBICANS (A)  Final   Report Status 11/11/2016 FINAL  Final  Blood Culture ID Panel (Reflexed)     Status: None   Collection Time: 11/06/16  7:00 PM  Result Value Ref Range Status   Enterococcus species NOT DETECTED NOT DETECTED Final   Listeria monocytogenes NOT DETECTED NOT DETECTED Final   Staphylococcus species NOT DETECTED NOT DETECTED Final   Staphylococcus aureus NOT DETECTED NOT DETECTED Final   Streptococcus species NOT DETECTED NOT DETECTED Final   Streptococcus agalactiae NOT DETECTED NOT DETECTED Final   Streptococcus pneumoniae NOT DETECTED NOT DETECTED Final   Streptococcus pyogenes NOT DETECTED NOT DETECTED Final   Acinetobacter baumannii NOT DETECTED NOT DETECTED Final   Enterobacteriaceae species NOT DETECTED NOT DETECTED Final   Enterobacter cloacae complex NOT DETECTED NOT DETECTED Final   Escherichia coli NOT DETECTED NOT DETECTED Final   Klebsiella oxytoca NOT DETECTED NOT DETECTED Final   Klebsiella pneumoniae NOT DETECTED NOT DETECTED Final   Proteus species NOT DETECTED NOT DETECTED Final   Serratia marcescens NOT DETECTED NOT DETECTED Final   Haemophilus influenzae NOT DETECTED NOT DETECTED Final   Neisseria meningitidis NOT DETECTED NOT DETECTED Final   Pseudomonas aeruginosa NOT DETECTED NOT DETECTED Final   Candida albicans NOT DETECTED NOT DETECTED Final   Candida glabrata NOT DETECTED NOT DETECTED Final   Candida krusei NOT DETECTED NOT DETECTED Final   Candida parapsilosis NOT DETECTED NOT DETECTED Final   Candida tropicalis NOT DETECTED NOT DETECTED Final  Culture, blood  (routine x 2)     Status: None   Collection Time: 11/15/16  8:23 PM  Result Value Ref Range Status   Specimen Description BLOOD HEMODIALYSIS CATHETER  Final   Special Requests BOTTLES DRAWN AEROBIC AND ANAEROBIC 10CC EACH  Final   Culture NO GROWTH 5 DAYS  Final   Report Status 11/20/2016 FINAL  Final  Culture, blood (routine x 2)     Status: None   Collection Time: 11/15/16  8:27 PM  Result Value Ref Range Status   Specimen Description BLOOD HEMODIALYSIS CATHETER  Final   Special Requests BOTTLES DRAWN AEROBIC AND ANAEROBIC 10CC EACH  Final   Culture NO GROWTH 5 DAYS  Final   Report Status 11/20/2016 FINAL  Final  Culture, respiratory (NON-Expectorated)     Status: None (Preliminary result)   Collection Time: 12/04/16  1:57 PM  Result Value Ref Range Status   Specimen Description TRACHEAL ASPIRATE  Final   Special Requests NONE  Final   Gram Stain ABUNDANT RARE GRAM POSITIVE COCCI IN PAIRS   Final   Culture   Final    RARE KLEBSIELLA PNEUMONIAE SUSCEPTIBILITIES TO FOLLOW    Report Status PENDING  Incomplete    Coagulation Studies: No results for input(s): LABPROT, INR in the last 72 hours.  Urinalysis: No results for input(s): COLORURINE, LABSPEC, PHURINE, GLUCOSEU, HGBUR, BILIRUBINUR, KETONESUR, PROTEINUR, UROBILINOGEN, NITRITE, LEUKOCYTESUR in the last 72 hours.  Invalid input(s): APPERANCEUR    Imaging: No results found.   Medications:   Aranesp 60 mcg q. Wednesday (3/21) Famotidine Iron sulfate tablets Midrin 5 mg twice a day miraLax daily Multivitamins     Assessment/ Plan:  83 y.o.  African American male with a PMHx of dementia, chronic kidney disease stage IV, congestive heart failure ejection fraction 25-30%, who was admitted to [facility] on 09/30/2016 for ongoing management of cholecystitis and volvulus status post exploratory laparotomy with colectomy. Patient was previously in [place]. Dialysis started at [facility] around 10/03/2016  1.   ESRD. ATN no recovery  2.  Anasarca, improved 3.  Acute respiratory failure, trach is capped. 4.  Metabolic acidosis. 5.  Anemia of CKD. Hgb down to 7.2, on aranesp 6.  Hyponatremia - volume overload, Na 133 7.  Fungemia, L IJ permcath now in place     Plan:   Patient completed hemodialysis today. Ultrafiltration achieved was 1.5 kg. Overall edema has improved with ultrafiltration. He continues to be anemic with a hemoglobin of 7.2. Consider blood transfusion for hemoglobin of7 or less.  Phosphorus acceptable at 2.5.  We will continue patient on hemodialysis Monday, Wednesday, Friday.    LOS: 0 Jesus Vega 3/28/20182:57 PM

## 2016-12-07 LAB — CULTURE, RESPIRATORY W GRAM STAIN

## 2016-12-07 LAB — CULTURE, RESPIRATORY

## 2016-12-08 LAB — CBC
HCT: 27.4 % — ABNORMAL LOW (ref 39.0–52.0)
Hemoglobin: 8.7 g/dL — ABNORMAL LOW (ref 13.0–17.0)
MCH: 28.9 pg (ref 26.0–34.0)
MCHC: 31.8 g/dL (ref 30.0–36.0)
MCV: 91 fL (ref 78.0–100.0)
Platelets: 149 10*3/uL — ABNORMAL LOW (ref 150–400)
RBC: 3.01 MIL/uL — ABNORMAL LOW (ref 4.22–5.81)
RDW: 18.7 % — AB (ref 11.5–15.5)
WBC: 15.9 10*3/uL — ABNORMAL HIGH (ref 4.0–10.5)

## 2016-12-08 LAB — RENAL FUNCTION PANEL
ALBUMIN: 2.5 g/dL — AB (ref 3.5–5.0)
Anion gap: 15 (ref 5–15)
BUN: 103 mg/dL — ABNORMAL HIGH (ref 6–20)
CO2: 21 mmol/L — ABNORMAL LOW (ref 22–32)
Calcium: 8.9 mg/dL (ref 8.9–10.3)
Chloride: 95 mmol/L — ABNORMAL LOW (ref 101–111)
Creatinine, Ser: 2.72 mg/dL — ABNORMAL HIGH (ref 0.61–1.24)
GFR calc Af Amer: 23 mL/min — ABNORMAL LOW (ref >60)
GFR calc non Af Amer: 20 mL/min — ABNORMAL LOW (ref >60)
GLUCOSE: 216 mg/dL — AB (ref 65–99)
PHOSPHORUS: 3.9 mg/dL (ref 2.5–4.6)
POTASSIUM: 3.7 mmol/L (ref 3.5–5.1)
SODIUM: 131 mmol/L — AB (ref 135–145)

## 2016-12-08 LAB — VANCOMYCIN, TROUGH: VANCOMYCIN TR: 12 ug/mL — AB (ref 15–20)

## 2016-12-08 NOTE — Progress Notes (Signed)
[facility]  ROUNDING NOTE   Subjective:  Patient seen during HD treatment BFR 400 BP drop noted;  Edema hs improved significantly over past few weeks Has been in chair earlier this week   Objective:  Vital signs in last 24 hours:  temperature 96.5 pulse 79 respirations 19 blood pressure 1125/87  Physical Exam:    General: No acute distress  Eyes: Anicteric  Neck: traceostomy capped  Lungs:  Scattered rhonchi, normal effort  Heart: S1S2 no rubs  Abdomen:  PEG in place, colostomy  Extremities: trace dependent edema, feet in soft boot support  Neurologic: Awake but not following commands  Skin: Warm & dry  Access: Left internal jugular permcath    Basic Metabolic Panel:  Recent Labs Lab 12/04/16 0803 12/06/16 0730 12/08/16 0647  NA 132* 133* 131*  K 3.7 3.3* 3.7  CL 93* 96* 95*  CO2 24 24 21*  GLUCOSE 180* 209* 216*  BUN 78* 81* 103*  CREATININE 3.65* 2.63* 2.72*  CALCIUM 8.9 8.6* 8.9  MG 2.2  --   --   PHOS 2.3* 2.5 3.9    Liver Function Tests:  Recent Labs Lab 12/04/16 0803 12/06/16 0730 12/08/16 0647  AST 59*  --   --   ALT 10*  --   --   ALKPHOS 83  --   --   BILITOT 0.6  --   --   PROT 8.7*  --   --   ALBUMIN 2.3*  2.3* 2.3* 2.5*   No results for input(s): LIPASE, AMYLASE in the last 168 hours. No results for input(s): AMMONIA in the last 168 hours.  CBC:  Recent Labs Lab 12/04/16 0803 12/06/16 0730 12/08/16 0647  WBC 18.8* 15.2* 15.9*  NEUTROABS 17.4*  --   --   HGB 7.5* 7.2* 8.7*  HCT 24.2* 23.1* 27.4*  MCV 93.8 91.3 91.0  PLT 139* 159 149*    Cardiac Enzymes: No results for input(s): CKTOTAL, CKMB, CKMBINDEX, TROPONINI in the last 168 hours.  BNP: Invalid input(s): POCBNP  CBG: No results for input(s): GLUCAP in the last 168 hours.  Microbiology: Results for orders placed or performed during the hospital encounter of 09/30/16  Culture, blood (routine x 2)     Status: None   Collection Time: 10/06/16  4:19  PM  Result Value Ref Range Status   Specimen Description BLOOD HEMODIALYSIS CATHETER  Final   Special Requests BOTTLES DRAWN AEROBIC ONLY 5CC  Final   Culture NO GROWTH 5 DAYS  Final   Report Status 10/11/2016 FINAL  Final  Culture, blood (routine x 2)     Status: None   Collection Time: 10/06/16  4:20 PM  Result Value Ref Range Status   Specimen Description BLOOD HEMODIALYSIS CATHETER  Final   Special Requests BOTTLES DRAWN AEROBIC ONLY 10CC  Final   Culture NO GROWTH 5 DAYS  Final   Report Status 10/11/2016 FINAL  Final  Culture, respiratory (NON-Expectorated)     Status: None   Collection Time: 10/27/16  2:03 PM  Result Value Ref Range Status   Specimen Description TRACHEAL ASPIRATE  Final   Special Requests NONE  Final   Gram Stain   Final    ABUNDANT WBC PRESENT, PREDOMINANTLY MONONUCLEAR ABUNDANT GRAM POSITIVE COCCI IN PAIRS ABUNDANT GRAM NEGATIVE COCCI IN PAIRS    Culture   Final    MODERATE METHICILLIN RESISTANT STAPHYLOCOCCUS AUREUS   Report Status 10/30/2016 FINAL  Final   Organism ID, Bacteria METHICILLIN RESISTANT STAPHYLOCOCCUS  AUREUS  Final      Susceptibility   Methicillin resistant staphylococcus aureus - MIC*    CIPROFLOXACIN >=8 RESISTANT Resistant     ERYTHROMYCIN >=8 RESISTANT Resistant     GENTAMICIN <=0.5 SENSITIVE Sensitive     OXACILLIN >=4 RESISTANT Resistant     TETRACYCLINE <=1 SENSITIVE Sensitive     VANCOMYCIN 1 SENSITIVE Sensitive     TRIMETH/SULFA <=10 SENSITIVE Sensitive     CLINDAMYCIN <=0.25 SENSITIVE Sensitive     RIFAMPIN <=0.5 SENSITIVE Sensitive     Inducible Clindamycin NEGATIVE Sensitive     * MODERATE METHICILLIN RESISTANT STAPHYLOCOCCUS AUREUS  Culture, blood (routine x 2)     Status: None   Collection Time: 11/06/16  6:50 PM  Result Value Ref Range Status   Specimen Description BLOOD RIGHT HAND  Final   Special Requests IN PEDIATRIC BOTTLE 2CC  Final   Culture NO GROWTH 5 DAYS  Final   Report Status 11/11/2016 FINAL  Final   Culture, blood (routine x 2)     Status: Abnormal   Collection Time: 11/06/16  7:00 PM  Result Value Ref Range Status   Specimen Description BLOOD LEFT HAND  Final   Special Requests BOTTLES DRAWN AEROBIC AND ANAEROBIC 5CC  Final   Culture  Setup Time   Final    BUDDING YEAST SEEN AEROBIC BOTTLE ONLY CRITICAL RESULT CALLED TO, READ BACK BY AND VERIFIED WITH: N SMITH,RN AT 1707 11/09/16 BY L BENFIELD    Culture CANDIDA SPECIES, NOT ALBICANS (A)  Final   Report Status 11/11/2016 FINAL  Final  Blood Culture ID Panel (Reflexed)     Status: None   Collection Time: 11/06/16  7:00 PM  Result Value Ref Range Status   Enterococcus species NOT DETECTED NOT DETECTED Final   Listeria monocytogenes NOT DETECTED NOT DETECTED Final   Staphylococcus species NOT DETECTED NOT DETECTED Final   Staphylococcus aureus NOT DETECTED NOT DETECTED Final   Streptococcus species NOT DETECTED NOT DETECTED Final   Streptococcus agalactiae NOT DETECTED NOT DETECTED Final   Streptococcus pneumoniae NOT DETECTED NOT DETECTED Final   Streptococcus pyogenes NOT DETECTED NOT DETECTED Final   Acinetobacter baumannii NOT DETECTED NOT DETECTED Final   Enterobacteriaceae species NOT DETECTED NOT DETECTED Final   Enterobacter cloacae complex NOT DETECTED NOT DETECTED Final   Escherichia coli NOT DETECTED NOT DETECTED Final   Klebsiella oxytoca NOT DETECTED NOT DETECTED Final   Klebsiella pneumoniae NOT DETECTED NOT DETECTED Final   Proteus species NOT DETECTED NOT DETECTED Final   Serratia marcescens NOT DETECTED NOT DETECTED Final   Haemophilus influenzae NOT DETECTED NOT DETECTED Final   Neisseria meningitidis NOT DETECTED NOT DETECTED Final   Pseudomonas aeruginosa NOT DETECTED NOT DETECTED Final   Candida albicans NOT DETECTED NOT DETECTED Final   Candida glabrata NOT DETECTED NOT DETECTED Final   Candida krusei NOT DETECTED NOT DETECTED Final   Candida parapsilosis NOT DETECTED NOT DETECTED Final   Candida  tropicalis NOT DETECTED NOT DETECTED Final  Culture, blood (routine x 2)     Status: None   Collection Time: 11/15/16  8:23 PM  Result Value Ref Range Status   Specimen Description BLOOD HEMODIALYSIS CATHETER  Final   Special Requests BOTTLES DRAWN AEROBIC AND ANAEROBIC 10CC EACH  Final   Culture NO GROWTH 5 DAYS  Final   Report Status 11/20/2016 FINAL  Final  Culture, blood (routine x 2)     Status: None   Collection Time: 11/15/16  8:27 PM  Result Value Ref Range Status   Specimen Description BLOOD HEMODIALYSIS CATHETER  Final   Special Requests BOTTLES DRAWN AEROBIC AND ANAEROBIC 10CC EACH  Final   Culture NO GROWTH 5 DAYS  Final   Report Status 11/20/2016 FINAL  Final  Culture, respiratory (NON-Expectorated)     Status: None   Collection Time: 12/04/16  1:57 PM  Result Value Ref Range Status   Specimen Description TRACHEAL ASPIRATE  Final   Special Requests NONE  Final   Gram Stain   Final    ABUNDANT WBC PRESENT, PREDOMINANTLY PMN RARE GRAM POSITIVE COCCI IN PAIRS    Culture RARE KLEBSIELLA PNEUMONIAE  Final   Report Status 12/07/2016 FINAL  Final   Organism ID, Bacteria KLEBSIELLA PNEUMONIAE  Final      Susceptibility   Klebsiella pneumoniae - MIC*    AMPICILLIN >=32 RESISTANT Resistant     CEFAZOLIN 16 SENSITIVE Sensitive     CEFEPIME <=1 SENSITIVE Sensitive     CEFTAZIDIME <=1 SENSITIVE Sensitive     CEFTRIAXONE <=1 SENSITIVE Sensitive     CIPROFLOXACIN <=0.25 SENSITIVE Sensitive     GENTAMICIN <=1 SENSITIVE Sensitive     IMIPENEM <=0.25 SENSITIVE Sensitive     TRIMETH/SULFA <=20 SENSITIVE Sensitive     AMPICILLIN/SULBACTAM 16 INTERMEDIATE Intermediate     PIP/TAZO 8 SENSITIVE Sensitive     Extended ESBL NEGATIVE Sensitive     * RARE KLEBSIELLA PNEUMONIAE    Coagulation Studies: No results for input(s): LABPROT, INR in the last 72 hours.  Urinalysis: No results for input(s): COLORURINE, LABSPEC, PHURINE, GLUCOSEU, HGBUR, BILIRUBINUR, KETONESUR, PROTEINUR,  UROBILINOGEN, NITRITE, LEUKOCYTESUR in the last 72 hours.  Invalid input(s): APPERANCEUR    Imaging: No results found.   Medications:   Aranesp 60 mcg q. Wednesday (3/21) Famotidine Iron sulfate tablets Midrin 5 mg twice a day miraLax daily Multivitamins     Assessment/ Plan:  83 y.o.  African American male with a PMHx of dementia, chronic kidney disease stage IV, congestive heart failure ejection fraction 25-30%, who was admitted to [facility] on 09/30/2016 for ongoing management of cholecystitis and volvulus status post exploratory laparotomy with colectomy. Patient was previously in [place]. Dialysis started at [facility] around 10/03/2016  1.  ESRD. ATN no recovery  2.  Anasarca, improved 3.  Acute respiratory failure, trach is capped. 4.  Metabolic acidosis. 5.  Anemia of CKD. Hgb down 8.7, on aranesp 6.  Hyponatremia - volume overload, Na 131 7.  Fungemia, L IJ permcath now in place     Plan:   Patient seen during HD today. Ultrafiltration achieved was 1 L so far. Became hypotensive. Limit UF to 1/2 of tube feeds given in 48 hrs Overall edema has improved with ultrafiltration.   Continue Aranesp.  Phosphorus acceptable at 3.9.   We will continue patient on hemodialysis Monday, Wednesday, Friday. Trach aspirate- Klemsiella- Abx as per promary team High BUN likely due to solumedrol: currently 40 iv TID    LOS: 0 Moustapha Tooker 3/30/20183:17 PM

## 2016-12-11 LAB — RENAL FUNCTION PANEL
Albumin: 2.3 g/dL — ABNORMAL LOW (ref 3.5–5.0)
Anion gap: 14 (ref 5–15)
BUN: 133 mg/dL — ABNORMAL HIGH (ref 6–20)
CHLORIDE: 95 mmol/L — AB (ref 101–111)
CO2: 24 mmol/L (ref 22–32)
Calcium: 9 mg/dL (ref 8.9–10.3)
Creatinine, Ser: 3.13 mg/dL — ABNORMAL HIGH (ref 0.61–1.24)
GFR, EST AFRICAN AMERICAN: 20 mL/min — AB (ref >60)
GFR, EST NON AFRICAN AMERICAN: 17 mL/min — AB (ref >60)
Glucose, Bld: 204 mg/dL — ABNORMAL HIGH (ref 65–99)
Phosphorus: 4.6 mg/dL (ref 2.5–4.6)
Potassium: 4.3 mmol/L (ref 3.5–5.1)
Sodium: 133 mmol/L — ABNORMAL LOW (ref 135–145)

## 2016-12-11 LAB — CBC
HEMATOCRIT: 26.4 % — AB (ref 39.0–52.0)
Hemoglobin: 8.5 g/dL — ABNORMAL LOW (ref 13.0–17.0)
MCH: 29.4 pg (ref 26.0–34.0)
MCHC: 32.2 g/dL (ref 30.0–36.0)
MCV: 91.3 fL (ref 78.0–100.0)
Platelets: 194 10*3/uL (ref 150–400)
RBC: 2.89 MIL/uL — AB (ref 4.22–5.81)
RDW: 18.8 % — ABNORMAL HIGH (ref 11.5–15.5)
WBC: 20.9 10*3/uL — AB (ref 4.0–10.5)

## 2016-12-11 NOTE — Progress Notes (Signed)
[facility]  ROUNDING NOTE   Subjective:  Patient completed hemodialysis today. Resting comfortably in bed at the moment.    Objective:  Vital signs in last 24 hours:  temperature 96.5 pulse 79 respirations 19 blood pressure 1125/87  Physical Exam:    General: No acute distress  Eyes: Anicteric  Neck: traceostomy capped  Lungs:  Scattered rhonchi, normal effort  Heart: S1S2 no rubs  Abdomen:  PEG in place, colostomy  Extremities: trace dependent edema, feet in soft boot support  Neurologic: Awake but not following commands  Skin: Warm & dry  Access: Left internal jugular permcath    Basic Metabolic Panel:  Recent Labs Lab 12/06/16 0730 12/08/16 0647 12/11/16 1235  NA 133* 131* 133*  K 3.3* 3.7 4.3  CL 96* 95* 95*  CO2 24 21* 24  GLUCOSE 209* 216* 204*  BUN 81* 103* 133*  CREATININE 2.63* 2.72* 3.13*  CALCIUM 8.6* 8.9 9.0  PHOS 2.5 3.9 4.6    Liver Function Tests:  Recent Labs Lab 12/06/16 0730 12/08/16 0647 12/11/16 1235  ALBUMIN 2.3* 2.5* 2.3*   No results for input(s): LIPASE, AMYLASE in the last 168 hours. No results for input(s): AMMONIA in the last 168 hours.  CBC:  Recent Labs Lab 12/06/16 0730 12/08/16 0647 12/11/16 1236  WBC 15.2* 15.9* 20.9*  HGB 7.2* 8.7* 8.5*  HCT 23.1* 27.4* 26.4*  MCV 91.3 91.0 91.3  PLT 159 149* 194    Cardiac Enzymes: No results for input(s): CKTOTAL, CKMB, CKMBINDEX, TROPONINI in the last 168 hours.  BNP: Invalid input(s): POCBNP  CBG: No results for input(s): GLUCAP in the last 168 hours.  Microbiology: Results for orders placed or performed during the hospital encounter of 09/30/16  Culture, blood (routine x 2)     Status: None   Collection Time: 10/06/16  4:19 PM  Result Value Ref Range Status   Specimen Description BLOOD HEMODIALYSIS CATHETER  Final   Special Requests BOTTLES DRAWN AEROBIC ONLY 5CC  Final   Culture NO GROWTH 5 DAYS  Final   Report Status 10/11/2016 FINAL  Final   Culture, blood (routine x 2)     Status: None   Collection Time: 10/06/16  4:20 PM  Result Value Ref Range Status   Specimen Description BLOOD HEMODIALYSIS CATHETER  Final   Special Requests BOTTLES DRAWN AEROBIC ONLY 10CC  Final   Culture NO GROWTH 5 DAYS  Final   Report Status 10/11/2016 FINAL  Final  Culture, respiratory (NON-Expectorated)     Status: None   Collection Time: 10/27/16  2:03 PM  Result Value Ref Range Status   Specimen Description TRACHEAL ASPIRATE  Final   Special Requests NONE  Final   Gram Stain   Final    ABUNDANT WBC PRESENT, PREDOMINANTLY MONONUCLEAR ABUNDANT GRAM POSITIVE COCCI IN PAIRS ABUNDANT GRAM NEGATIVE COCCI IN PAIRS    Culture   Final    MODERATE METHICILLIN RESISTANT STAPHYLOCOCCUS AUREUS   Report Status 10/30/2016 FINAL  Final   Organism ID, Bacteria METHICILLIN RESISTANT STAPHYLOCOCCUS AUREUS  Final      Susceptibility   Methicillin resistant staphylococcus aureus - MIC*    CIPROFLOXACIN >=8 RESISTANT Resistant     ERYTHROMYCIN >=8 RESISTANT Resistant     GENTAMICIN <=0.5 SENSITIVE Sensitive     OXACILLIN >=4 RESISTANT Resistant     TETRACYCLINE <=1 SENSITIVE Sensitive     VANCOMYCIN 1 SENSITIVE Sensitive     TRIMETH/SULFA <=10 SENSITIVE Sensitive     CLINDAMYCIN <=0.25 SENSITIVE  Sensitive     RIFAMPIN <=0.5 SENSITIVE Sensitive     Inducible Clindamycin NEGATIVE Sensitive     * MODERATE METHICILLIN RESISTANT STAPHYLOCOCCUS AUREUS  Culture, blood (routine x 2)     Status: None   Collection Time: 11/06/16  6:50 PM  Result Value Ref Range Status   Specimen Description BLOOD RIGHT HAND  Final   Special Requests IN PEDIATRIC BOTTLE 2CC  Final   Culture NO GROWTH 5 DAYS  Final   Report Status 11/11/2016 FINAL  Final  Culture, blood (routine x 2)     Status: Abnormal   Collection Time: 11/06/16  7:00 PM  Result Value Ref Range Status   Specimen Description BLOOD LEFT HAND  Final   Special Requests BOTTLES DRAWN AEROBIC AND ANAEROBIC 5CC   Final   Culture  Setup Time   Final    BUDDING YEAST SEEN AEROBIC BOTTLE ONLY CRITICAL RESULT CALLED TO, READ BACK BY AND VERIFIED WITH: N SMITH,RN AT 1707 11/09/16 BY L BENFIELD    Culture CANDIDA SPECIES, NOT ALBICANS (A)  Final   Report Status 11/11/2016 FINAL  Final  Blood Culture ID Panel (Reflexed)     Status: None   Collection Time: 11/06/16  7:00 PM  Result Value Ref Range Status   Enterococcus species NOT DETECTED NOT DETECTED Final   Listeria monocytogenes NOT DETECTED NOT DETECTED Final   Staphylococcus species NOT DETECTED NOT DETECTED Final   Staphylococcus aureus NOT DETECTED NOT DETECTED Final   Streptococcus species NOT DETECTED NOT DETECTED Final   Streptococcus agalactiae NOT DETECTED NOT DETECTED Final   Streptococcus pneumoniae NOT DETECTED NOT DETECTED Final   Streptococcus pyogenes NOT DETECTED NOT DETECTED Final   Acinetobacter baumannii NOT DETECTED NOT DETECTED Final   Enterobacteriaceae species NOT DETECTED NOT DETECTED Final   Enterobacter cloacae complex NOT DETECTED NOT DETECTED Final   Escherichia coli NOT DETECTED NOT DETECTED Final   Klebsiella oxytoca NOT DETECTED NOT DETECTED Final   Klebsiella pneumoniae NOT DETECTED NOT DETECTED Final   Proteus species NOT DETECTED NOT DETECTED Final   Serratia marcescens NOT DETECTED NOT DETECTED Final   Haemophilus influenzae NOT DETECTED NOT DETECTED Final   Neisseria meningitidis NOT DETECTED NOT DETECTED Final   Pseudomonas aeruginosa NOT DETECTED NOT DETECTED Final   Candida albicans NOT DETECTED NOT DETECTED Final   Candida glabrata NOT DETECTED NOT DETECTED Final   Candida krusei NOT DETECTED NOT DETECTED Final   Candida parapsilosis NOT DETECTED NOT DETECTED Final   Candida tropicalis NOT DETECTED NOT DETECTED Final  Culture, blood (routine x 2)     Status: None   Collection Time: 11/15/16  8:23 PM  Result Value Ref Range Status   Specimen Description BLOOD HEMODIALYSIS CATHETER  Final   Special  Requests BOTTLES DRAWN AEROBIC AND ANAEROBIC 10CC EACH  Final   Culture NO GROWTH 5 DAYS  Final   Report Status 11/20/2016 FINAL  Final  Culture, blood (routine x 2)     Status: None   Collection Time: 11/15/16  8:27 PM  Result Value Ref Range Status   Specimen Description BLOOD HEMODIALYSIS CATHETER  Final   Special Requests BOTTLES DRAWN AEROBIC AND ANAEROBIC 10CC EACH  Final   Culture NO GROWTH 5 DAYS  Final   Report Status 11/20/2016 FINAL  Final  Culture, respiratory (NON-Expectorated)     Status: None   Collection Time: 12/04/16  1:57 PM  Result Value Ref Range Status   Specimen Description TRACHEAL ASPIRATE  Final  Special Requests NONE  Final   Gram Stain   Final    ABUNDANT WBC PRESENT, PREDOMINANTLY PMN RARE GRAM POSITIVE COCCI IN PAIRS    Culture RARE KLEBSIELLA PNEUMONIAE  Final   Report Status 12/07/2016 FINAL  Final   Organism ID, Bacteria KLEBSIELLA PNEUMONIAE  Final      Susceptibility   Klebsiella pneumoniae - MIC*    AMPICILLIN >=32 RESISTANT Resistant     CEFAZOLIN 16 SENSITIVE Sensitive     CEFEPIME <=1 SENSITIVE Sensitive     CEFTAZIDIME <=1 SENSITIVE Sensitive     CEFTRIAXONE <=1 SENSITIVE Sensitive     CIPROFLOXACIN <=0.25 SENSITIVE Sensitive     GENTAMICIN <=1 SENSITIVE Sensitive     IMIPENEM <=0.25 SENSITIVE Sensitive     TRIMETH/SULFA <=20 SENSITIVE Sensitive     AMPICILLIN/SULBACTAM 16 INTERMEDIATE Intermediate     PIP/TAZO 8 SENSITIVE Sensitive     Extended ESBL NEGATIVE Sensitive     * RARE KLEBSIELLA PNEUMONIAE    Coagulation Studies: No results for input(s): LABPROT, INR in the last 72 hours.  Urinalysis: No results for input(s): COLORURINE, LABSPEC, PHURINE, GLUCOSEU, HGBUR, BILIRUBINUR, KETONESUR, PROTEINUR, UROBILINOGEN, NITRITE, LEUKOCYTESUR in the last 72 hours.  Invalid input(s): APPERANCEUR    Imaging: No results found.   Medications:   Aranesp 60 mcg q. Wednesday (3/21) Famotidine Iron sulfate tablets Midrin 5 mg twice a  day miraLax daily Multivitamins     Assessment/ Plan:  83 y.o.  African American male with a PMHx of dementia, chronic kidney disease stage IV, congestive heart failure ejection fraction 25-30%, who was admitted to [facility] on 09/30/2016 for ongoing management of cholecystitis and volvulus status post exploratory laparotomy with colectomy. Patient was previously in [place]. Dialysis started at [facility] around 10/03/2016  1.  ESRD. ATN no recovery  2.  Anasarca, improved 3.  Acute respiratory failure, trach is capped. 4.  Metabolic acidosis. 5.  Anemia of CKD. Hgb 8.5 on aranesp 6.  Hyponatremia - volume overload, Na 133 7.  Fungemia, L IJ permcath now in place     Plan:   Patient completed hemodialysis today. We will plan for next ounces treatment on Wednesday. Overall his edema has significantly improved with dialysis and ultrafiltration. He remains anemic and is on Aranesp. Most recent hemoglobin was 8.5.  Overall however patient remains quite debilitated. We will continue to monitor the patient's clinical status during his hospitalization here.   LOS: 0 Jesus Vega 4/2/20186:44 PM

## 2016-12-12 ENCOUNTER — Other Ambulatory Visit (HOSPITAL_COMMUNITY): Payer: Medicare Other

## 2016-12-12 LAB — BLOOD GAS, ARTERIAL
Acid-base deficit: 2.9 mmol/L — ABNORMAL HIGH (ref 0.0–2.0)
BICARBONATE: 24.1 mmol/L (ref 20.0–28.0)
FIO2: 0.6
O2 SAT: 84.7 %
PO2 ART: 63.7 mmHg — AB (ref 83.0–108.0)
Patient temperature: 98.6
pCO2 arterial: 63.8 mmHg — ABNORMAL HIGH (ref 32.0–48.0)
pH, Arterial: 7.201 — ABNORMAL LOW (ref 7.350–7.450)

## 2016-12-13 LAB — RENAL FUNCTION PANEL
ANION GAP: 12 (ref 5–15)
Albumin: 1.8 g/dL — ABNORMAL LOW (ref 3.5–5.0)
BUN: 112 mg/dL — ABNORMAL HIGH (ref 6–20)
CALCIUM: 8.6 mg/dL — AB (ref 8.9–10.3)
CO2: 22 mmol/L (ref 22–32)
Chloride: 98 mmol/L — ABNORMAL LOW (ref 101–111)
Creatinine, Ser: 2.95 mg/dL — ABNORMAL HIGH (ref 0.61–1.24)
GFR calc Af Amer: 21 mL/min — ABNORMAL LOW (ref >60)
GFR calc non Af Amer: 18 mL/min — ABNORMAL LOW (ref >60)
GLUCOSE: 198 mg/dL — AB (ref 65–99)
Phosphorus: 3.6 mg/dL (ref 2.5–4.6)
Potassium: 3.6 mmol/L (ref 3.5–5.1)
SODIUM: 132 mmol/L — AB (ref 135–145)

## 2016-12-13 LAB — CBC
HCT: 23.1 % — ABNORMAL LOW (ref 39.0–52.0)
HEMOGLOBIN: 7.1 g/dL — AB (ref 13.0–17.0)
MCH: 28.5 pg (ref 26.0–34.0)
MCHC: 30.7 g/dL (ref 30.0–36.0)
MCV: 92.8 fL (ref 78.0–100.0)
PLATELETS: 187 10*3/uL (ref 150–400)
RBC: 2.49 MIL/uL — ABNORMAL LOW (ref 4.22–5.81)
RDW: 19.3 % — ABNORMAL HIGH (ref 11.5–15.5)
WBC: 18.1 10*3/uL — ABNORMAL HIGH (ref 4.0–10.5)

## 2016-12-13 NOTE — Progress Notes (Signed)
[facility]  ROUNDING NOTE   Subjective:  Patient seen and evaluated during hemodialysis. Tolerating well. Ultrafiltration target 1.5 kg.  Patient with worsening respiratory status and now back on the ventilator.  Objective:  Vital signs in last 24 hours:  Temperature 97.3 pulse 81 respirations 14 blood pressure 88/54  Physical Exam:    General: No acute distress  Eyes: Anicteric  Neck: traceostomy in place, hooked to ventilator  Lungs:  Scattered rhonchi, normal effort  Heart: S1S2 no rubs  Abdomen:  PEG in place, colostomy  Extremities: trace dependent edema, feet in soft boot support  Neurologic: Awake but not following commands  Skin: Warm & dry  Access: Left internal jugular permcath    Basic Metabolic Panel:  Recent Labs Lab 12/08/16 0647 12/11/16 1235 12/13/16 1300  NA 131* 133* 132*  K 3.7 4.3 3.6  CL 95* 95* 98*  CO2 21* 24 22  GLUCOSE 216* 204* 198*  BUN 103* 133* 112*  CREATININE 2.72* 3.13* 2.95*  CALCIUM 8.9 9.0 8.6*  PHOS 3.9 4.6 3.6    Liver Function Tests:  Recent Labs Lab 12/08/16 0647 12/11/16 1235 12/13/16 1300  ALBUMIN 2.5* 2.3* 1.8*   No results for input(s): LIPASE, AMYLASE in the last 168 hours. No results for input(s): AMMONIA in the last 168 hours.  CBC:  Recent Labs Lab 12/08/16 0647 12/11/16 1236 12/13/16 1300  WBC 15.9* 20.9* PENDING  HGB 8.7* 8.5* 7.1*  HCT 27.4* 26.4* 23.1*  MCV 91.0 91.3 92.8  PLT 149* 194 PENDING    Cardiac Enzymes: No results for input(s): CKTOTAL, CKMB, CKMBINDEX, TROPONINI in the last 168 hours.  BNP: Invalid input(s): POCBNP  CBG: No results for input(s): GLUCAP in the last 168 hours.  Microbiology: Results for orders placed or performed during the hospital encounter of 09/30/16  Culture, blood (routine x 2)     Status: None   Collection Time: 10/06/16  4:19 PM  Result Value Ref Range Status   Specimen Description BLOOD HEMODIALYSIS CATHETER  Final   Special Requests  BOTTLES DRAWN AEROBIC ONLY 5CC  Final   Culture NO GROWTH 5 DAYS  Final   Report Status 10/11/2016 FINAL  Final  Culture, blood (routine x 2)     Status: None   Collection Time: 10/06/16  4:20 PM  Result Value Ref Range Status   Specimen Description BLOOD HEMODIALYSIS CATHETER  Final   Special Requests BOTTLES DRAWN AEROBIC ONLY 10CC  Final   Culture NO GROWTH 5 DAYS  Final   Report Status 10/11/2016 FINAL  Final  Culture, respiratory (NON-Expectorated)     Status: None   Collection Time: 10/27/16  2:03 PM  Result Value Ref Range Status   Specimen Description TRACHEAL ASPIRATE  Final   Special Requests NONE  Final   Gram Stain   Final    ABUNDANT WBC PRESENT, PREDOMINANTLY MONONUCLEAR ABUNDANT GRAM POSITIVE COCCI IN PAIRS ABUNDANT GRAM NEGATIVE COCCI IN PAIRS    Culture   Final    MODERATE METHICILLIN RESISTANT STAPHYLOCOCCUS AUREUS   Report Status 10/30/2016 FINAL  Final   Organism ID, Bacteria METHICILLIN RESISTANT STAPHYLOCOCCUS AUREUS  Final      Susceptibility   Methicillin resistant staphylococcus aureus - MIC*    CIPROFLOXACIN >=8 RESISTANT Resistant     ERYTHROMYCIN >=8 RESISTANT Resistant     GENTAMICIN <=0.5 SENSITIVE Sensitive     OXACILLIN >=4 RESISTANT Resistant     TETRACYCLINE <=1 SENSITIVE Sensitive     VANCOMYCIN 1 SENSITIVE Sensitive  TRIMETH/SULFA <=10 SENSITIVE Sensitive     CLINDAMYCIN <=0.25 SENSITIVE Sensitive     RIFAMPIN <=0.5 SENSITIVE Sensitive     Inducible Clindamycin NEGATIVE Sensitive     * MODERATE METHICILLIN RESISTANT STAPHYLOCOCCUS AUREUS  Culture, blood (routine x 2)     Status: None   Collection Time: 11/06/16  6:50 PM  Result Value Ref Range Status   Specimen Description BLOOD RIGHT HAND  Final   Special Requests IN PEDIATRIC BOTTLE 2CC  Final   Culture NO GROWTH 5 DAYS  Final   Report Status 11/11/2016 FINAL  Final  Culture, blood (routine x 2)     Status: Abnormal   Collection Time: 11/06/16  7:00 PM  Result Value Ref Range  Status   Specimen Description BLOOD LEFT HAND  Final   Special Requests BOTTLES DRAWN AEROBIC AND ANAEROBIC 5CC  Final   Culture  Setup Time   Final    BUDDING YEAST SEEN AEROBIC BOTTLE ONLY CRITICAL RESULT CALLED TO, READ BACK BY AND VERIFIED WITH: N SMITH,RN AT 1707 11/09/16 BY L BENFIELD    Culture CANDIDA SPECIES, NOT ALBICANS (A)  Final   Report Status 11/11/2016 FINAL  Final  Blood Culture ID Panel (Reflexed)     Status: None   Collection Time: 11/06/16  7:00 PM  Result Value Ref Range Status   Enterococcus species NOT DETECTED NOT DETECTED Final   Listeria monocytogenes NOT DETECTED NOT DETECTED Final   Staphylococcus species NOT DETECTED NOT DETECTED Final   Staphylococcus aureus NOT DETECTED NOT DETECTED Final   Streptococcus species NOT DETECTED NOT DETECTED Final   Streptococcus agalactiae NOT DETECTED NOT DETECTED Final   Streptococcus pneumoniae NOT DETECTED NOT DETECTED Final   Streptococcus pyogenes NOT DETECTED NOT DETECTED Final   Acinetobacter baumannii NOT DETECTED NOT DETECTED Final   Enterobacteriaceae species NOT DETECTED NOT DETECTED Final   Enterobacter cloacae complex NOT DETECTED NOT DETECTED Final   Escherichia coli NOT DETECTED NOT DETECTED Final   Klebsiella oxytoca NOT DETECTED NOT DETECTED Final   Klebsiella pneumoniae NOT DETECTED NOT DETECTED Final   Proteus species NOT DETECTED NOT DETECTED Final   Serratia marcescens NOT DETECTED NOT DETECTED Final   Haemophilus influenzae NOT DETECTED NOT DETECTED Final   Neisseria meningitidis NOT DETECTED NOT DETECTED Final   Pseudomonas aeruginosa NOT DETECTED NOT DETECTED Final   Candida albicans NOT DETECTED NOT DETECTED Final   Candida glabrata NOT DETECTED NOT DETECTED Final   Candida krusei NOT DETECTED NOT DETECTED Final   Candida parapsilosis NOT DETECTED NOT DETECTED Final   Candida tropicalis NOT DETECTED NOT DETECTED Final  Culture, blood (routine x 2)     Status: None   Collection Time: 11/15/16   8:23 PM  Result Value Ref Range Status   Specimen Description BLOOD HEMODIALYSIS CATHETER  Final   Special Requests BOTTLES DRAWN AEROBIC AND ANAEROBIC 10CC EACH  Final   Culture NO GROWTH 5 DAYS  Final   Report Status 11/20/2016 FINAL  Final  Culture, blood (routine x 2)     Status: None   Collection Time: 11/15/16  8:27 PM  Result Value Ref Range Status   Specimen Description BLOOD HEMODIALYSIS CATHETER  Final   Special Requests BOTTLES DRAWN AEROBIC AND ANAEROBIC 10CC EACH  Final   Culture NO GROWTH 5 DAYS  Final   Report Status 11/20/2016 FINAL  Final  Culture, respiratory (NON-Expectorated)     Status: None   Collection Time: 12/04/16  1:57 PM  Result Value Ref Range  Status   Specimen Description TRACHEAL ASPIRATE  Final   Special Requests NONE  Final   Gram Stain   Final    ABUNDANT WBC PRESENT, PREDOMINANTLY PMN RARE GRAM POSITIVE COCCI IN PAIRS    Culture RARE KLEBSIELLA PNEUMONIAE  Final   Report Status 12/07/2016 FINAL  Final   Organism ID, Bacteria KLEBSIELLA PNEUMONIAE  Final      Susceptibility   Klebsiella pneumoniae - MIC*    AMPICILLIN >=32 RESISTANT Resistant     CEFAZOLIN 16 SENSITIVE Sensitive     CEFEPIME <=1 SENSITIVE Sensitive     CEFTAZIDIME <=1 SENSITIVE Sensitive     CEFTRIAXONE <=1 SENSITIVE Sensitive     CIPROFLOXACIN <=0.25 SENSITIVE Sensitive     GENTAMICIN <=1 SENSITIVE Sensitive     IMIPENEM <=0.25 SENSITIVE Sensitive     TRIMETH/SULFA <=20 SENSITIVE Sensitive     AMPICILLIN/SULBACTAM 16 INTERMEDIATE Intermediate     PIP/TAZO 8 SENSITIVE Sensitive     Extended ESBL NEGATIVE Sensitive     * RARE KLEBSIELLA PNEUMONIAE    Coagulation Studies: No results for input(s): LABPROT, INR in the last 72 hours.  Urinalysis: No results for input(s): COLORURINE, LABSPEC, PHURINE, GLUCOSEU, HGBUR, BILIRUBINUR, KETONESUR, PROTEINUR, UROBILINOGEN, NITRITE, LEUKOCYTESUR in the last 72 hours.  Invalid input(s): APPERANCEUR    Imaging: Dg Chest Port 1  View  Result Date: 12/12/2016 CLINICAL DATA:  Internal jugular vein thromboembolism EXAM: PORTABLE CHEST 1 VIEW COMPARISON:  12/03/2016 FINDINGS: Tracheostomy appliance is centered on the airway. Dual-lumen left jugular central line extends to the cavoatrial junction. Persistent diffuse airspace opacities bilaterally, right worse than left. There is some interval improvement with partial clearance on the left. Moderate right pleural effusion. This is probably unchanged. No pneumothorax. IMPRESSION: 1. Satisfactorily positioned support equipment. 2. Persistent diffuse opacities in both lungs with some interval clearance of on the left. Persistent right pleural effusion. Electronically Signed   By: Ellery Plunk M.D.   On: 12/12/2016 22:52     Medications:   Aranesp 60 mcg q. Wednesday (3/21) Famotidine Iron sulfate tablets Midrin 5 mg twice a day miraLax daily Multivitamins     Assessment/ Plan:  83 y.o.  African American male with a PMHx of dementia, chronic kidney disease stage IV, congestive heart failure ejection fraction 25-30%, who was admitted to [facility] on 09/30/2016 for ongoing management of cholecystitis and volvulus status post exploratory laparotomy with colectomy. Patient was previously in [place]. Dialysis started at [facility] around 10/03/2016  1.  ESRD. ATN no recovery  2.  Anasarca, improved 3.  Acute respiratory failure, back on ventilator 12/12/16. 4.  Metabolic acidosis. 5.  Anemia of CKD. Hgb 7.1 on aranesp 6.  Hyponatremia - volume overload, Na 132 7.  Fungemia, L IJ permcath now in place     Plan:   Patient seen and evaluated during hemodialysis today. He is now back on the ventilator unfortunately secondary to pneumonia. We will complete hemodialysis today with ultrafiltration target 1.5 kg. We will plan for dialysis again on Friday. His hemoglobin is dropped to 7.1. Consider blood transfusion for hemoglobin of 7 or less. Otherwise continue to  monitor serum electrolytes. Overall prognosis remains guarded.  LOS: 0 Shatiqua Heroux 4/4/20182:28 PM

## 2016-12-15 LAB — CBC
HEMATOCRIT: 23.5 % — AB (ref 39.0–52.0)
HEMOGLOBIN: 7.3 g/dL — AB (ref 13.0–17.0)
MCH: 28.4 pg (ref 26.0–34.0)
MCHC: 31.1 g/dL (ref 30.0–36.0)
MCV: 91.4 fL (ref 78.0–100.0)
Platelets: 201 10*3/uL (ref 150–400)
RBC: 2.57 MIL/uL — AB (ref 4.22–5.81)
RDW: 19.7 % — AB (ref 11.5–15.5)
WBC: 19.4 10*3/uL — ABNORMAL HIGH (ref 4.0–10.5)

## 2016-12-15 LAB — RENAL FUNCTION PANEL
ALBUMIN: 2 g/dL — AB (ref 3.5–5.0)
Anion gap: 12 (ref 5–15)
BUN: 90 mg/dL — AB (ref 6–20)
CHLORIDE: 96 mmol/L — AB (ref 101–111)
CO2: 24 mmol/L (ref 22–32)
Calcium: 9.1 mg/dL (ref 8.9–10.3)
Creatinine, Ser: 2.5 mg/dL — ABNORMAL HIGH (ref 0.61–1.24)
GFR, EST AFRICAN AMERICAN: 26 mL/min — AB (ref >60)
GFR, EST NON AFRICAN AMERICAN: 22 mL/min — AB (ref >60)
Glucose, Bld: 174 mg/dL — ABNORMAL HIGH (ref 65–99)
PHOSPHORUS: 2.4 mg/dL — AB (ref 2.5–4.6)
POTASSIUM: 3.3 mmol/L — AB (ref 3.5–5.1)
Sodium: 132 mmol/L — ABNORMAL LOW (ref 135–145)

## 2016-12-15 NOTE — Progress Notes (Signed)
[facility]  ROUNDING NOTE   Subjective:  The patient completed hemodialysis today. Ultrafiltration Achieved was less than 500 cc. Patient is lethargic but arousable.  Objective:  Vital signs in last 24 hours:  Temperature 97.3 pulse 81 respirations 14 blood pressure 88/54  Physical Exam:    General: No acute distress  Eyes: Anicteric  Neck: traceostomy in place, hooked to ventilator  Lungs:  Scattered rhonchi, vent assisted  Heart: S1S2 no rubs  Abdomen:  PEG in place, colostomy  Extremities: trace dependent edema, feet in soft boot support  Neurologic: Lethargic but arousable  Skin: Warm & dry  Access: Left internal jugular permcath    Basic Metabolic Panel:  Recent Labs Lab 12/11/16 1235 12/13/16 1300 12/15/16 0740  NA 133* 132* 132*  K 4.3 3.6 3.3*  CL 95* 98* 96*  CO2 GLUCOSE 204* 198* 174*  BUN 133* 112* 90*  CREATININE 3.13* 2.95* 2.50*  CALCIUM 9.0 8.6* 9.1  PHOS 4.6 3.6 2.4*    Liver Function Tests:  Recent Labs Lab 12/11/16 1235 12/13/16 1300 12/15/16 0740  ALBUMIN 2.3* 1.8* 2.0*   No results for input(s): LIPASE, AMYLASE in the last 168 hours. No results for input(s): AMMONIA in the last 168 hours.  CBC:  Recent Labs Lab 12/11/16 1236 12/13/16 1300 12/15/16 0740  WBC 20.9* 18.1* 19.4*  HGB 8.5* 7.1* 7.3*  HCT 26.4* 23.1* 23.5*  MCV 91.3 92.8 91.4  PLT 194 187 201    Cardiac Enzymes: No results for input(s): CKTOTAL, CKMB, CKMBINDEX, TROPONINI in the last 168 hours.  BNP: Invalid input(s): POCBNP  CBG: No results for input(s): GLUCAP in the last 168 hours.  Microbiology: Results for orders placed or performed during the hospital encounter of 09/30/16  Culture, blood (routine x 2)     Status: None   Collection Time: 10/06/16  4:19 PM  Result Value Ref Range Status   Specimen Description BLOOD HEMODIALYSIS CATHETER  Final   Special Requests BOTTLES DRAWN AEROBIC ONLY 5CC  Final   Culture NO GROWTH 5  DAYS  Final   Report Status 10/11/2016 FINAL  Final  Culture, blood (routine x 2)     Status: None   Collection Time: 10/06/16  4:20 PM  Result Value Ref Range Status   Specimen Description BLOOD HEMODIALYSIS CATHETER  Final   Special Requests BOTTLES DRAWN AEROBIC ONLY 10CC  Final   Culture NO GROWTH 5 DAYS  Final   Report Status 10/11/2016 FINAL  Final  Culture, respiratory (NON-Expectorated)     Status: None   Collection Time: 10/27/16  2:03 PM  Result Value Ref Range Status   Specimen Description TRACHEAL ASPIRATE  Final   Special Requests NONE  Final   Gram Stain   Final    ABUNDANT WBC PRESENT, PREDOMINANTLY MONONUCLEAR ABUNDANT GRAM POSITIVE COCCI IN PAIRS ABUNDANT GRAM NEGATIVE COCCI IN PAIRS    Culture   Final    MODERATE METHICILLIN RESISTANT STAPHYLOCOCCUS AUREUS   Report Status 10/30/2016 FINAL  Final   Organism ID, Bacteria METHICILLIN RESISTANT STAPHYLOCOCCUS AUREUS  Final      Susceptibility   Methicillin resistant staphylococcus aureus - MIC*    CIPROFLOXACIN >=8 RESISTANT Resistant     ERYTHROMYCIN >=8 RESISTANT Resistant     GENTAMICIN <=0.5 SENSITIVE Sensitive     OXACILLIN >=4 RESISTANT Resistant     TETRACYCLINE <=1 SENSITIVE Sensitive     VANCOMYCIN 1 SENSITIVE Sensitive     TRIMETH/SULFA <=10 SENSITIVE Sensitive  CLINDAMYCIN <=0.25 SENSITIVE Sensitive     RIFAMPIN <=0.5 SENSITIVE Sensitive     Inducible Clindamycin NEGATIVE Sensitive     * MODERATE METHICILLIN RESISTANT STAPHYLOCOCCUS AUREUS  Culture, blood (routine x 2)     Status: None   Collection Time: 11/06/16  6:50 PM  Result Value Ref Range Status   Specimen Description BLOOD RIGHT HAND  Final   Special Requests IN PEDIATRIC BOTTLE 2CC  Final   Culture NO GROWTH 5 DAYS  Final   Report Status 11/11/2016 FINAL  Final  Culture, blood (routine x 2)     Status: Abnormal   Collection Time: 11/06/16  7:00 PM  Result Value Ref Range Status   Specimen Description BLOOD LEFT HAND  Final   Special  Requests BOTTLES DRAWN AEROBIC AND ANAEROBIC 5CC  Final   Culture  Setup Time   Final    BUDDING YEAST SEEN AEROBIC BOTTLE ONLY CRITICAL RESULT CALLED TO, READ BACK BY AND VERIFIED WITH: N SMITH,RN AT 1707 11/09/16 BY L BENFIELD    Culture CANDIDA SPECIES, NOT ALBICANS (A)  Final   Report Status 11/11/2016 FINAL  Final  Blood Culture ID Panel (Reflexed)     Status: None   Collection Time: 11/06/16  7:00 PM  Result Value Ref Range Status   Enterococcus species NOT DETECTED NOT DETECTED Final   Listeria monocytogenes NOT DETECTED NOT DETECTED Final   Staphylococcus species NOT DETECTED NOT DETECTED Final   Staphylococcus aureus NOT DETECTED NOT DETECTED Final   Streptococcus species NOT DETECTED NOT DETECTED Final   Streptococcus agalactiae NOT DETECTED NOT DETECTED Final   Streptococcus pneumoniae NOT DETECTED NOT DETECTED Final   Streptococcus pyogenes NOT DETECTED NOT DETECTED Final   Acinetobacter baumannii NOT DETECTED NOT DETECTED Final   Enterobacteriaceae species NOT DETECTED NOT DETECTED Final   Enterobacter cloacae complex NOT DETECTED NOT DETECTED Final   Escherichia coli NOT DETECTED NOT DETECTED Final   Klebsiella oxytoca NOT DETECTED NOT DETECTED Final   Klebsiella pneumoniae NOT DETECTED NOT DETECTED Final   Proteus species NOT DETECTED NOT DETECTED Final   Serratia marcescens NOT DETECTED NOT DETECTED Final   Haemophilus influenzae NOT DETECTED NOT DETECTED Final   Neisseria meningitidis NOT DETECTED NOT DETECTED Final   Pseudomonas aeruginosa NOT DETECTED NOT DETECTED Final   Candida albicans NOT DETECTED NOT DETECTED Final   Candida glabrata NOT DETECTED NOT DETECTED Final   Candida krusei NOT DETECTED NOT DETECTED Final   Candida parapsilosis NOT DETECTED NOT DETECTED Final   Candida tropicalis NOT DETECTED NOT DETECTED Final  Culture, blood (routine x 2)     Status: None   Collection Time: 11/15/16  8:23 PM  Result Value Ref Range Status   Specimen Description  BLOOD HEMODIALYSIS CATHETER  Final   Special Requests BOTTLES DRAWN AEROBIC AND ANAEROBIC 10CC EACH  Final   Culture NO GROWTH 5 DAYS  Final   Report Status 11/20/2016 FINAL  Final  Culture, blood (routine x 2)     Status: None   Collection Time: 11/15/16  8:27 PM  Result Value Ref Range Status   Specimen Description BLOOD HEMODIALYSIS CATHETER  Final   Special Requests BOTTLES DRAWN AEROBIC AND ANAEROBIC 10CC EACH  Final   Culture NO GROWTH 5 DAYS  Final   Report Status 11/20/2016 FINAL  Final  Culture, respiratory (NON-Expectorated)     Status: None   Collection Time: 12/04/16  1:57 PM  Result Value Ref Range Status   Specimen Description TRACHEAL ASPIRATE  Final   Special Requests NONE  Final   Gram Stain   Final    ABUNDANT WBC PRESENT, PREDOMINANTLY PMN RARE GRAM POSITIVE COCCI IN PAIRS    Culture RARE KLEBSIELLA PNEUMONIAE  Final   Report Status 12/07/2016 FINAL  Final   Organism ID, Bacteria KLEBSIELLA PNEUMONIAE  Final      Susceptibility   Klebsiella pneumoniae - MIC*    AMPICILLIN >=32 RESISTANT Resistant     CEFAZOLIN 16 SENSITIVE Sensitive     CEFEPIME <=1 SENSITIVE Sensitive     CEFTAZIDIME <=1 SENSITIVE Sensitive     CEFTRIAXONE <=1 SENSITIVE Sensitive     CIPROFLOXACIN <=0.25 SENSITIVE Sensitive     GENTAMICIN <=1 SENSITIVE Sensitive     IMIPENEM <=0.25 SENSITIVE Sensitive     TRIMETH/SULFA <=20 SENSITIVE Sensitive     AMPICILLIN/SULBACTAM 16 INTERMEDIATE Intermediate     PIP/TAZO 8 SENSITIVE Sensitive     Extended ESBL NEGATIVE Sensitive     * RARE KLEBSIELLA PNEUMONIAE    Coagulation Studies: No results for input(s): LABPROT, INR in the last 72 hours.  Urinalysis: No results for input(s): COLORURINE, LABSPEC, PHURINE, GLUCOSEU, HGBUR, BILIRUBINUR, KETONESUR, PROTEINUR, UROBILINOGEN, NITRITE, LEUKOCYTESUR in the last 72 hours.  Invalid input(s): APPERANCEUR    Imaging: No results found.   Medications:   Aranesp 60 mcg q. Wednesday  (3/21) Famotidine Iron sulfate tablets Midrin 5 mg twice a day miraLax daily Multivitamins     Assessment/ Plan:  83 y.o.  African American male with a PMHx of dementia, chronic kidney disease stage IV, congestive heart failure ejection fraction 25-30%, who was admitted to [facility] on 09/30/2016 for ongoing management of cholecystitis and volvulus status post exploratory laparotomy with colectomy. Patient was previously in [place]. Dialysis started at [facility] around 10/03/2016  1.  ESRD. ATN no recovery  2.  Anasarca, improved 3.  Acute respiratory failure, back on ventilator 12/12/16. 4.  Metabolic acidosis. 5.  Anemia of CKD. Hgb 7.3 on aranesp 6.  Hyponatremia - volume overload, Na 132 7.  Fungemia, L IJ permcath now in place     Plan:   Patient seen at bedside.  Completed hemodialysis today. Ultrafiltration achieved was only 0.5 kg. Therefore we will plan for albumin with dialysis on Monday. Patient is now back on the ventilator. Continue to monitor hemoglobin. Hemoglobin currently is 7.3. Consider transfusion for hemoglobin of 7 or less. Hyponatremia appears to be stable with a serum sodium of 132 which we will continue to monitor. Further plan as patient progresses.  LOS: 0 Kalila Adkison 4/6/20183:54 PM

## 2016-12-16 ENCOUNTER — Other Ambulatory Visit (HOSPITAL_COMMUNITY): Payer: Medicare Other

## 2016-12-18 LAB — RENAL FUNCTION PANEL
ALBUMIN: 1.8 g/dL — AB (ref 3.5–5.0)
Anion gap: 10 (ref 5–15)
BUN: 110 mg/dL — AB (ref 6–20)
CALCIUM: 9.6 mg/dL (ref 8.9–10.3)
CO2: 24 mmol/L (ref 22–32)
CREATININE: 2.79 mg/dL — AB (ref 0.61–1.24)
Chloride: 95 mmol/L — ABNORMAL LOW (ref 101–111)
GFR calc Af Amer: 23 mL/min — ABNORMAL LOW (ref >60)
GFR, EST NON AFRICAN AMERICAN: 20 mL/min — AB (ref >60)
GLUCOSE: 199 mg/dL — AB (ref 65–99)
PHOSPHORUS: 3.2 mg/dL (ref 2.5–4.6)
Potassium: 4 mmol/L (ref 3.5–5.1)
SODIUM: 129 mmol/L — AB (ref 135–145)

## 2016-12-18 LAB — CBC
HEMATOCRIT: 26.2 % — AB (ref 39.0–52.0)
Hemoglobin: 8.2 g/dL — ABNORMAL LOW (ref 13.0–17.0)
MCH: 28.8 pg (ref 26.0–34.0)
MCHC: 31.3 g/dL (ref 30.0–36.0)
MCV: 91.9 fL (ref 78.0–100.0)
PLATELETS: 193 10*3/uL (ref 150–400)
RBC: 2.85 MIL/uL — ABNORMAL LOW (ref 4.22–5.81)
RDW: 19 % — AB (ref 11.5–15.5)
WBC: 20.8 10*3/uL — AB (ref 4.0–10.5)

## 2016-12-18 NOTE — Progress Notes (Signed)
[facility]  ROUNDING NOTE   Subjective:  Patient is seen during dialysis treatment He is tolerating fair Intradialytic hypotension continues to be an issue and is limiting lung removal Patient remains on ventilator with FiO2 30%, PEEP 5   Objective:  Vital signs in last 24 hours:  Temperature 97.6, pulse 69, respirations 19, blood pressure 82/50  Physical Exam:    General: No acute distress  Eyes: Anicteric  Neck: traceostomy in place,   Lungs:  Scattered rhonchi, vent assisted  Heart: S1S2 no rubs  Abdomen:  PEG in place, colostomy  Extremities: trace dependent edema, feet in soft boot support  Neurologic: Lethargic but arousable  Skin: Warm & dry  Access: Left internal jugular permcath    Basic Metabolic Panel:  Recent Labs Lab 12/13/16 1300 12/15/16 0740 12/18/16 0809  NA 132* 132* 129*  K 3.6 3.3* 4.0  CL 98* 96* 95*  CO2 GLUCOSE 198* 174* 199*  BUN 112* 90* 110*  CREATININE 2.95* 2.50* 2.79*  CALCIUM 8.6* 9.1 9.6  PHOS 3.6 2.4* 3.2    Liver Function Tests:  Recent Labs Lab 12/13/16 1300 12/15/16 0740 12/18/16 0809  ALBUMIN 1.8* 2.0* 1.8*   No results for input(s): LIPASE, AMYLASE in the last 168 hours. No results for input(s): AMMONIA in the last 168 hours.  CBC:  Recent Labs Lab 12/13/16 1300 12/15/16 0740 12/18/16 0809  WBC 18.1* 19.4* 20.8*  HGB 7.1* 7.3* 8.2*  HCT 23.1* 23.5* 26.2*  MCV 92.8 91.4 91.9  PLT 187 201 193    Cardiac Enzymes: No results for input(s): CKTOTAL, CKMB, CKMBINDEX, TROPONINI in the last 168 hours.  BNP: Invalid input(s): POCBNP  CBG: No results for input(s): GLUCAP in the last 168 hours.  Microbiology: Results for orders placed or performed during the hospital encounter of 09/30/16  Culture, blood (routine x 2)     Status: None   Collection Time: 10/06/16  4:19 PM  Result Value Ref Range Status   Specimen Description BLOOD HEMODIALYSIS CATHETER  Final   Special Requests  BOTTLES DRAWN AEROBIC ONLY 5CC  Final   Culture NO GROWTH 5 DAYS  Final   Report Status 10/11/2016 FINAL  Final  Culture, blood (routine x 2)     Status: None   Collection Time: 10/06/16  4:20 PM  Result Value Ref Range Status   Specimen Description BLOOD HEMODIALYSIS CATHETER  Final   Special Requests BOTTLES DRAWN AEROBIC ONLY 10CC  Final   Culture NO GROWTH 5 DAYS  Final   Report Status 10/11/2016 FINAL  Final  Culture, respiratory (NON-Expectorated)     Status: None   Collection Time: 10/27/16  2:03 PM  Result Value Ref Range Status   Specimen Description TRACHEAL ASPIRATE  Final   Special Requests NONE  Final   Gram Stain   Final    ABUNDANT WBC PRESENT, PREDOMINANTLY MONONUCLEAR ABUNDANT GRAM POSITIVE COCCI IN PAIRS ABUNDANT GRAM NEGATIVE COCCI IN PAIRS    Culture   Final    MODERATE METHICILLIN RESISTANT STAPHYLOCOCCUS AUREUS   Report Status 10/30/2016 FINAL  Final   Organism ID, Bacteria METHICILLIN RESISTANT STAPHYLOCOCCUS AUREUS  Final      Susceptibility   Methicillin resistant staphylococcus aureus - MIC*    CIPROFLOXACIN >=8 RESISTANT Resistant     ERYTHROMYCIN >=8 RESISTANT Resistant     GENTAMICIN <=0.5 SENSITIVE Sensitive     OXACILLIN >=4 RESISTANT Resistant     TETRACYCLINE <=1 SENSITIVE Sensitive  VANCOMYCIN 1 SENSITIVE Sensitive     TRIMETH/SULFA <=10 SENSITIVE Sensitive     CLINDAMYCIN <=0.25 SENSITIVE Sensitive     RIFAMPIN <=0.5 SENSITIVE Sensitive     Inducible Clindamycin NEGATIVE Sensitive     * MODERATE METHICILLIN RESISTANT STAPHYLOCOCCUS AUREUS  Culture, blood (routine x 2)     Status: None   Collection Time: 11/06/16  6:50 PM  Result Value Ref Range Status   Specimen Description BLOOD RIGHT HAND  Final   Special Requests IN PEDIATRIC BOTTLE 2CC  Final   Culture NO GROWTH 5 DAYS  Final   Report Status 11/11/2016 FINAL  Final  Culture, blood (routine x 2)     Status: Abnormal   Collection Time: 11/06/16  7:00 PM  Result Value Ref Range  Status   Specimen Description BLOOD LEFT HAND  Final   Special Requests BOTTLES DRAWN AEROBIC AND ANAEROBIC 5CC  Final   Culture  Setup Time   Final    BUDDING YEAST SEEN AEROBIC BOTTLE ONLY CRITICAL RESULT CALLED TO, READ BACK BY AND VERIFIED WITH: N SMITH,RN AT 1707 11/09/16 BY L BENFIELD    Culture CANDIDA SPECIES, NOT ALBICANS (A)  Final   Report Status 11/11/2016 FINAL  Final  Blood Culture ID Panel (Reflexed)     Status: None   Collection Time: 11/06/16  7:00 PM  Result Value Ref Range Status   Enterococcus species NOT DETECTED NOT DETECTED Final   Listeria monocytogenes NOT DETECTED NOT DETECTED Final   Staphylococcus species NOT DETECTED NOT DETECTED Final   Staphylococcus aureus NOT DETECTED NOT DETECTED Final   Streptococcus species NOT DETECTED NOT DETECTED Final   Streptococcus agalactiae NOT DETECTED NOT DETECTED Final   Streptococcus pneumoniae NOT DETECTED NOT DETECTED Final   Streptococcus pyogenes NOT DETECTED NOT DETECTED Final   Acinetobacter baumannii NOT DETECTED NOT DETECTED Final   Enterobacteriaceae species NOT DETECTED NOT DETECTED Final   Enterobacter cloacae complex NOT DETECTED NOT DETECTED Final   Escherichia coli NOT DETECTED NOT DETECTED Final   Klebsiella oxytoca NOT DETECTED NOT DETECTED Final   Klebsiella pneumoniae NOT DETECTED NOT DETECTED Final   Proteus species NOT DETECTED NOT DETECTED Final   Serratia marcescens NOT DETECTED NOT DETECTED Final   Haemophilus influenzae NOT DETECTED NOT DETECTED Final   Neisseria meningitidis NOT DETECTED NOT DETECTED Final   Pseudomonas aeruginosa NOT DETECTED NOT DETECTED Final   Candida albicans NOT DETECTED NOT DETECTED Final   Candida glabrata NOT DETECTED NOT DETECTED Final   Candida krusei NOT DETECTED NOT DETECTED Final   Candida parapsilosis NOT DETECTED NOT DETECTED Final   Candida tropicalis NOT DETECTED NOT DETECTED Final  Culture, blood (routine x 2)     Status: None   Collection Time: 11/15/16   8:23 PM  Result Value Ref Range Status   Specimen Description BLOOD HEMODIALYSIS CATHETER  Final   Special Requests BOTTLES DRAWN AEROBIC AND ANAEROBIC 10CC EACH  Final   Culture NO GROWTH 5 DAYS  Final   Report Status 11/20/2016 FINAL  Final  Culture, blood (routine x 2)     Status: None   Collection Time: 11/15/16  8:27 PM  Result Value Ref Range Status   Specimen Description BLOOD HEMODIALYSIS CATHETER  Final   Special Requests BOTTLES DRAWN AEROBIC AND ANAEROBIC 10CC EACH  Final   Culture NO GROWTH 5 DAYS  Final   Report Status 11/20/2016 FINAL  Final  Culture, respiratory (NON-Expectorated)     Status: None   Collection Time: 12/04/16  1:57 PM  Result Value Ref Range Status   Specimen Description TRACHEAL ASPIRATE  Final   Special Requests NONE  Final   Gram Stain   Final    ABUNDANT WBC PRESENT, PREDOMINANTLY PMN RARE GRAM POSITIVE COCCI IN PAIRS    Culture RARE KLEBSIELLA PNEUMONIAE  Final   Report Status 12/07/2016 FINAL  Final   Organism ID, Bacteria KLEBSIELLA PNEUMONIAE  Final      Susceptibility   Klebsiella pneumoniae - MIC*    AMPICILLIN >=32 RESISTANT Resistant     CEFAZOLIN 16 SENSITIVE Sensitive     CEFEPIME <=1 SENSITIVE Sensitive     CEFTAZIDIME <=1 SENSITIVE Sensitive     CEFTRIAXONE <=1 SENSITIVE Sensitive     CIPROFLOXACIN <=0.25 SENSITIVE Sensitive     GENTAMICIN <=1 SENSITIVE Sensitive     IMIPENEM <=0.25 SENSITIVE Sensitive     TRIMETH/SULFA <=20 SENSITIVE Sensitive     AMPICILLIN/SULBACTAM 16 INTERMEDIATE Intermediate     PIP/TAZO 8 SENSITIVE Sensitive     Extended ESBL NEGATIVE Sensitive     * RARE KLEBSIELLA PNEUMONIAE    Coagulation Studies: No results for input(s): LABPROT, INR in the last 72 hours.  Urinalysis: No results for input(s): COLORURINE, LABSPEC, PHURINE, GLUCOSEU, HGBUR, BILIRUBINUR, KETONESUR, PROTEINUR, UROBILINOGEN, NITRITE, LEUKOCYTESUR in the last 72 hours.  Invalid input(s): APPERANCEUR    Imaging: No results  found.   Medications:   Aranesp 60 mcg q. Wednesday (3/21) Famotidine Iron sulfate tablets Midrin 5 mg twice a day miraLax daily Multivitamins     Assessment/ Plan:  83 y.o.  African American male with a PMHx of dementia, chronic kidney disease stage IV, congestive heart failure ejection fraction 25-30%, who was admitted to [facility] on 09/30/2016 for ongoing management of cholecystitis and volvulus status post exploratory laparotomy with colectomy. Patient was previously in [place]. Dialysis started at [facility] around 10/03/2016  1.  ESRD. ATN no recovery  2.  Anasarca, improved 3.  Acute respiratory failure, back on ventilator 12/12/16. 4.  Metabolic acidosis. 5.  Anemia of CKD. Hgb 8.2 on aranesp 6.  Hyponatremia - volume overload, Na 129 7.  Fungemia, L IJ permcath now in place     Plan:   Patient seen at bedside.  He is currently undergoing hemodialysis. Blood pressure remains low at 82/50 which limits ultrafiltration. Goal today is 2 L. Tube feeds continued. Patient continuing on midodrine. hemoglobin 8.2. Phosphorus 3.2 which is acceptable. Next hemodialysis planned for Wednesday Further plan as patient progresses.  LOS: 0 Timberlynn Kizziah 4/9/20183:55 PM

## 2016-12-20 LAB — RENAL FUNCTION PANEL
ANION GAP: 11 (ref 5–15)
Albumin: 2.1 g/dL — ABNORMAL LOW (ref 3.5–5.0)
BUN: 91 mg/dL — ABNORMAL HIGH (ref 6–20)
CO2: 25 mmol/L (ref 22–32)
CREATININE: 2.55 mg/dL — AB (ref 0.61–1.24)
Calcium: 9.8 mg/dL (ref 8.9–10.3)
Chloride: 95 mmol/L — ABNORMAL LOW (ref 101–111)
GFR, EST AFRICAN AMERICAN: 25 mL/min — AB (ref >60)
GFR, EST NON AFRICAN AMERICAN: 22 mL/min — AB (ref >60)
Glucose, Bld: 183 mg/dL — ABNORMAL HIGH (ref 65–99)
PHOSPHORUS: 3.5 mg/dL (ref 2.5–4.6)
Potassium: 3.8 mmol/L (ref 3.5–5.1)
SODIUM: 131 mmol/L — AB (ref 135–145)

## 2016-12-20 LAB — CBC
HCT: 26.4 % — ABNORMAL LOW (ref 39.0–52.0)
HEMOGLOBIN: 8.4 g/dL — AB (ref 13.0–17.0)
MCH: 28.9 pg (ref 26.0–34.0)
MCHC: 31.8 g/dL (ref 30.0–36.0)
MCV: 90.7 fL (ref 78.0–100.0)
PLATELETS: 177 10*3/uL (ref 150–400)
RBC: 2.91 MIL/uL — AB (ref 4.22–5.81)
RDW: 19 % — ABNORMAL HIGH (ref 11.5–15.5)
WBC: 21.1 10*3/uL — AB (ref 4.0–10.5)

## 2016-12-20 NOTE — Progress Notes (Signed)
[facility]  ROUNDING NOTE   Subjective:  Patient had dialysis today. 2.1 L removed   Intradialytic hypotension continues to be an issue and is limiting fluid removal Patient currently undergoing TCTs   Objective:  Vital signs in last 24 hours:  Post HD VSTemperature 97.1, pulse 89, respirations 22, blood pressure 105/61  Physical Exam:    General: No acute distress  Eyes: Anicteric  Neck: traceostomy in place,   Lungs:  Scattered rhonchi,    Heart: S1S2 no rubs  Abdomen:  PEG in place, colostomy  Extremities: trace dependent edema, feet in soft boot support  Neurologic: Lethargic but arousable; does not follow commands  Skin: Warm & dry  Access: Left internal jugular permcath    Basic Metabolic Panel:  Recent Labs Lab 12/15/16 0740 12/18/16 0809 12/20/16 1110  NA 132* 129* 131*  K 3.3* 4.0 3.8  CL 96* 95* 95*  CO2 GLUCOSE 174* 199* 183*  BUN 90* 110* 91*  CREATININE 2.50* 2.79* 2.55*  CALCIUM 9.1 9.6 9.8  PHOS 2.4* 3.2 3.5    Liver Function Tests:  Recent Labs Lab 12/15/16 0740 12/18/16 0809 12/20/16 1110  ALBUMIN 2.0* 1.8* 2.1*   No results for input(s): LIPASE, AMYLASE in the last 168 hours. No results for input(s): AMMONIA in the last 168 hours.  CBC:  Recent Labs Lab 12/15/16 0740 12/18/16 0809 12/20/16 1110  WBC 19.4* 20.8* 21.1*  HGB 7.3* 8.2* 8.4*  HCT 23.5* 26.2* 26.4*  MCV 91.4 91.9 90.7  PLT 201 193 177    Cardiac Enzymes: No results for input(s): CKTOTAL, CKMB, CKMBINDEX, TROPONINI in the last 168 hours.  BNP: Invalid input(s): POCBNP  CBG: No results for input(s): GLUCAP in the last 168 hours.  Microbiology: Results for orders placed or performed during the hospital encounter of 09/30/16  Culture, blood (routine x 2)     Status: None   Collection Time: 10/06/16  4:19 PM  Result Value Ref Range Status   Specimen Description BLOOD HEMODIALYSIS CATHETER  Final   Special Requests BOTTLES DRAWN  AEROBIC ONLY 5CC  Final   Culture NO GROWTH 5 DAYS  Final   Report Status 10/11/2016 FINAL  Final  Culture, blood (routine x 2)     Status: None   Collection Time: 10/06/16  4:20 PM  Result Value Ref Range Status   Specimen Description BLOOD HEMODIALYSIS CATHETER  Final   Special Requests BOTTLES DRAWN AEROBIC ONLY 10CC  Final   Culture NO GROWTH 5 DAYS  Final   Report Status 10/11/2016 FINAL  Final  Culture, respiratory (NON-Expectorated)     Status: None   Collection Time: 10/27/16  2:03 PM  Result Value Ref Range Status   Specimen Description TRACHEAL ASPIRATE  Final   Special Requests NONE  Final   Gram Stain   Final    ABUNDANT WBC PRESENT, PREDOMINANTLY MONONUCLEAR ABUNDANT GRAM POSITIVE COCCI IN PAIRS ABUNDANT GRAM NEGATIVE COCCI IN PAIRS    Culture   Final    MODERATE METHICILLIN RESISTANT STAPHYLOCOCCUS AUREUS   Report Status 10/30/2016 FINAL  Final   Organism ID, Bacteria METHICILLIN RESISTANT STAPHYLOCOCCUS AUREUS  Final      Susceptibility   Methicillin resistant staphylococcus aureus - MIC*    CIPROFLOXACIN >=8 RESISTANT Resistant     ERYTHROMYCIN >=8 RESISTANT Resistant     GENTAMICIN <=0.5 SENSITIVE Sensitive     OXACILLIN >=4 RESISTANT Resistant     TETRACYCLINE <=1 SENSITIVE Sensitive  VANCOMYCIN 1 SENSITIVE Sensitive     TRIMETH/SULFA <=10 SENSITIVE Sensitive     CLINDAMYCIN <=0.25 SENSITIVE Sensitive     RIFAMPIN <=0.5 SENSITIVE Sensitive     Inducible Clindamycin NEGATIVE Sensitive     * MODERATE METHICILLIN RESISTANT STAPHYLOCOCCUS AUREUS  Culture, blood (routine x 2)     Status: None   Collection Time: 11/06/16  6:50 PM  Result Value Ref Range Status   Specimen Description BLOOD RIGHT HAND  Final   Special Requests IN PEDIATRIC BOTTLE 2CC  Final   Culture NO GROWTH 5 DAYS  Final   Report Status 11/11/2016 FINAL  Final  Culture, blood (routine x 2)     Status: Abnormal   Collection Time: 11/06/16  7:00 PM  Result Value Ref Range Status   Specimen  Description BLOOD LEFT HAND  Final   Special Requests BOTTLES DRAWN AEROBIC AND ANAEROBIC 5CC  Final   Culture  Setup Time   Final    BUDDING YEAST SEEN AEROBIC BOTTLE ONLY CRITICAL RESULT CALLED TO, READ BACK BY AND VERIFIED WITH: N SMITH,RN AT 1707 11/09/16 BY L BENFIELD    Culture CANDIDA SPECIES, NOT ALBICANS (A)  Final   Report Status 11/11/2016 FINAL  Final  Blood Culture ID Panel (Reflexed)     Status: None   Collection Time: 11/06/16  7:00 PM  Result Value Ref Range Status   Enterococcus species NOT DETECTED NOT DETECTED Final   Listeria monocytogenes NOT DETECTED NOT DETECTED Final   Staphylococcus species NOT DETECTED NOT DETECTED Final   Staphylococcus aureus NOT DETECTED NOT DETECTED Final   Streptococcus species NOT DETECTED NOT DETECTED Final   Streptococcus agalactiae NOT DETECTED NOT DETECTED Final   Streptococcus pneumoniae NOT DETECTED NOT DETECTED Final   Streptococcus pyogenes NOT DETECTED NOT DETECTED Final   Acinetobacter baumannii NOT DETECTED NOT DETECTED Final   Enterobacteriaceae species NOT DETECTED NOT DETECTED Final   Enterobacter cloacae complex NOT DETECTED NOT DETECTED Final   Escherichia coli NOT DETECTED NOT DETECTED Final   Klebsiella oxytoca NOT DETECTED NOT DETECTED Final   Klebsiella pneumoniae NOT DETECTED NOT DETECTED Final   Proteus species NOT DETECTED NOT DETECTED Final   Serratia marcescens NOT DETECTED NOT DETECTED Final   Haemophilus influenzae NOT DETECTED NOT DETECTED Final   Neisseria meningitidis NOT DETECTED NOT DETECTED Final   Pseudomonas aeruginosa NOT DETECTED NOT DETECTED Final   Candida albicans NOT DETECTED NOT DETECTED Final   Candida glabrata NOT DETECTED NOT DETECTED Final   Candida krusei NOT DETECTED NOT DETECTED Final   Candida parapsilosis NOT DETECTED NOT DETECTED Final   Candida tropicalis NOT DETECTED NOT DETECTED Final  Culture, blood (routine x 2)     Status: None   Collection Time: 11/15/16  8:23 PM  Result  Value Ref Range Status   Specimen Description BLOOD HEMODIALYSIS CATHETER  Final   Special Requests BOTTLES DRAWN AEROBIC AND ANAEROBIC 10CC EACH  Final   Culture NO GROWTH 5 DAYS  Final   Report Status 11/20/2016 FINAL  Final  Culture, blood (routine x 2)     Status: None   Collection Time: 11/15/16  8:27 PM  Result Value Ref Range Status   Specimen Description BLOOD HEMODIALYSIS CATHETER  Final   Special Requests BOTTLES DRAWN AEROBIC AND ANAEROBIC 10CC EACH  Final   Culture NO GROWTH 5 DAYS  Final   Report Status 11/20/2016 FINAL  Final  Culture, respiratory (NON-Expectorated)     Status: None   Collection Time: 12/04/16  1:57 PM  Result Value Ref Range Status   Specimen Description TRACHEAL ASPIRATE  Final   Special Requests NONE  Final   Gram Stain   Final    ABUNDANT WBC PRESENT, PREDOMINANTLY PMN RARE GRAM POSITIVE COCCI IN PAIRS    Culture RARE KLEBSIELLA PNEUMONIAE  Final   Report Status 12/07/2016 FINAL  Final   Organism ID, Bacteria KLEBSIELLA PNEUMONIAE  Final      Susceptibility   Klebsiella pneumoniae - MIC*    AMPICILLIN >=32 RESISTANT Resistant     CEFAZOLIN 16 SENSITIVE Sensitive     CEFEPIME <=1 SENSITIVE Sensitive     CEFTAZIDIME <=1 SENSITIVE Sensitive     CEFTRIAXONE <=1 SENSITIVE Sensitive     CIPROFLOXACIN <=0.25 SENSITIVE Sensitive     GENTAMICIN <=1 SENSITIVE Sensitive     IMIPENEM <=0.25 SENSITIVE Sensitive     TRIMETH/SULFA <=20 SENSITIVE Sensitive     AMPICILLIN/SULBACTAM 16 INTERMEDIATE Intermediate     PIP/TAZO 8 SENSITIVE Sensitive     Extended ESBL NEGATIVE Sensitive     * RARE KLEBSIELLA PNEUMONIAE    Coagulation Studies: No results for input(s): LABPROT, INR in the last 72 hours.  Urinalysis: No results for input(s): COLORURINE, LABSPEC, PHURINE, GLUCOSEU, HGBUR, BILIRUBINUR, KETONESUR, PROTEINUR, UROBILINOGEN, NITRITE, LEUKOCYTESUR in the last 72 hours.  Invalid input(s): APPERANCEUR    Imaging: No results found.   Medications:    Aranesp 60 mcg q. Wednesday (3/21) Famotidine Iron sulfate tablets Midrin 5 mg twice a day miraLax daily Multivitamins     Assessment/ Plan:  83 y.o.  African American male with a PMHx of dementia, chronic kidney disease stage IV, congestive heart failure ejection fraction 25-30%, who was admitted to [facility] on 09/30/2016 for ongoing management of cholecystitis and volvulus status post exploratory laparotomy with colectomy. Patient was previously in [place]. Dialysis started at [facility] around 10/03/2016  1.  ESRD. ATN no recovery  2.  Anasarca, improved 3.  Acute respiratory failure, back on ventilator 12/12/16. 4.  Metabolic acidosis. 5.  Anemia of CKD. Hgb 8.2 on aranesp 6.  Hyponatremia - volume overload, Na 129 7.  Fungemia, L IJ permcath now in place     Plan:   2.1 L removed today.  Tube feeds continued. Patient continuing on midodrine.  hemoglobin 8.4; continue Aranasp Phosphorus 3.5 which is acceptable. Continue HD Further plan as patient progresses.   LOS: 0 Jesus Vega 4/11/20184:49 PM

## 2016-12-22 ENCOUNTER — Other Ambulatory Visit (HOSPITAL_COMMUNITY): Payer: Medicare Other

## 2016-12-22 LAB — BLOOD CULTURE ID PANEL (REFLEXED)
Acinetobacter baumannii: NOT DETECTED
CANDIDA ALBICANS: NOT DETECTED
CANDIDA GLABRATA: NOT DETECTED
CANDIDA KRUSEI: NOT DETECTED
CANDIDA PARAPSILOSIS: NOT DETECTED
CANDIDA TROPICALIS: NOT DETECTED
ENTEROBACTER CLOACAE COMPLEX: NOT DETECTED
ENTEROBACTERIACEAE SPECIES: NOT DETECTED
ESCHERICHIA COLI: NOT DETECTED
Enterococcus species: NOT DETECTED
Haemophilus influenzae: NOT DETECTED
KLEBSIELLA OXYTOCA: NOT DETECTED
KLEBSIELLA PNEUMONIAE: NOT DETECTED
Listeria monocytogenes: NOT DETECTED
Methicillin resistance: DETECTED — AB
NEISSERIA MENINGITIDIS: NOT DETECTED
PROTEUS SPECIES: NOT DETECTED
Pseudomonas aeruginosa: NOT DETECTED
STAPHYLOCOCCUS SPECIES: DETECTED — AB
STREPTOCOCCUS PYOGENES: NOT DETECTED
Serratia marcescens: NOT DETECTED
Staphylococcus aureus (BCID): NOT DETECTED
Streptococcus agalactiae: NOT DETECTED
Streptococcus pneumoniae: NOT DETECTED
Streptococcus species: NOT DETECTED

## 2016-12-22 LAB — RENAL FUNCTION PANEL
ANION GAP: 11 (ref 5–15)
Albumin: 2 g/dL — ABNORMAL LOW (ref 3.5–5.0)
BUN: 73 mg/dL — AB (ref 6–20)
CHLORIDE: 94 mmol/L — AB (ref 101–111)
CO2: 25 mmol/L (ref 22–32)
Calcium: 9.8 mg/dL (ref 8.9–10.3)
Creatinine, Ser: 2.42 mg/dL — ABNORMAL HIGH (ref 0.61–1.24)
GFR calc Af Amer: 27 mL/min — ABNORMAL LOW (ref >60)
GFR calc non Af Amer: 23 mL/min — ABNORMAL LOW (ref >60)
GLUCOSE: 142 mg/dL — AB (ref 65–99)
POTASSIUM: 3.9 mmol/L (ref 3.5–5.1)
Phosphorus: 2.7 mg/dL (ref 2.5–4.6)
Sodium: 130 mmol/L — ABNORMAL LOW (ref 135–145)

## 2016-12-22 LAB — CBC
HEMATOCRIT: 25.5 % — AB (ref 39.0–52.0)
HEMOGLOBIN: 7.9 g/dL — AB (ref 13.0–17.0)
MCH: 28 pg (ref 26.0–34.0)
MCHC: 31 g/dL (ref 30.0–36.0)
MCV: 90.4 fL (ref 78.0–100.0)
Platelets: 149 10*3/uL — ABNORMAL LOW (ref 150–400)
RBC: 2.82 MIL/uL — ABNORMAL LOW (ref 4.22–5.81)
RDW: 18.1 % — AB (ref 11.5–15.5)
WBC: 16.6 10*3/uL — ABNORMAL HIGH (ref 4.0–10.5)

## 2016-12-22 NOTE — Progress Notes (Signed)
Informed by primary team that patient has been transitioned to Hospice Will sign off Please re consult as necessary

## 2016-12-24 LAB — CULTURE, BLOOD (ROUTINE X 2): SPECIAL REQUESTS: ADEQUATE

## 2017-01-09 DEATH — deceased

## 2017-02-09 NOTE — Addendum Note (Signed)
Addendum  created 02/09/17 1203 by Quisha Mabie, MD   Sign clinical note    

## 2017-06-04 IMAGING — CR DG ABD PORTABLE 1V
2 series · 2 of 2 positions shown · non-contrast
Comparison: October 23, 2016

CLINICAL DATA: Evaluate NG tube placement.

EXAM:
PORTABLE ABDOMEN - 1 VIEW

[AP (1 of 2)]
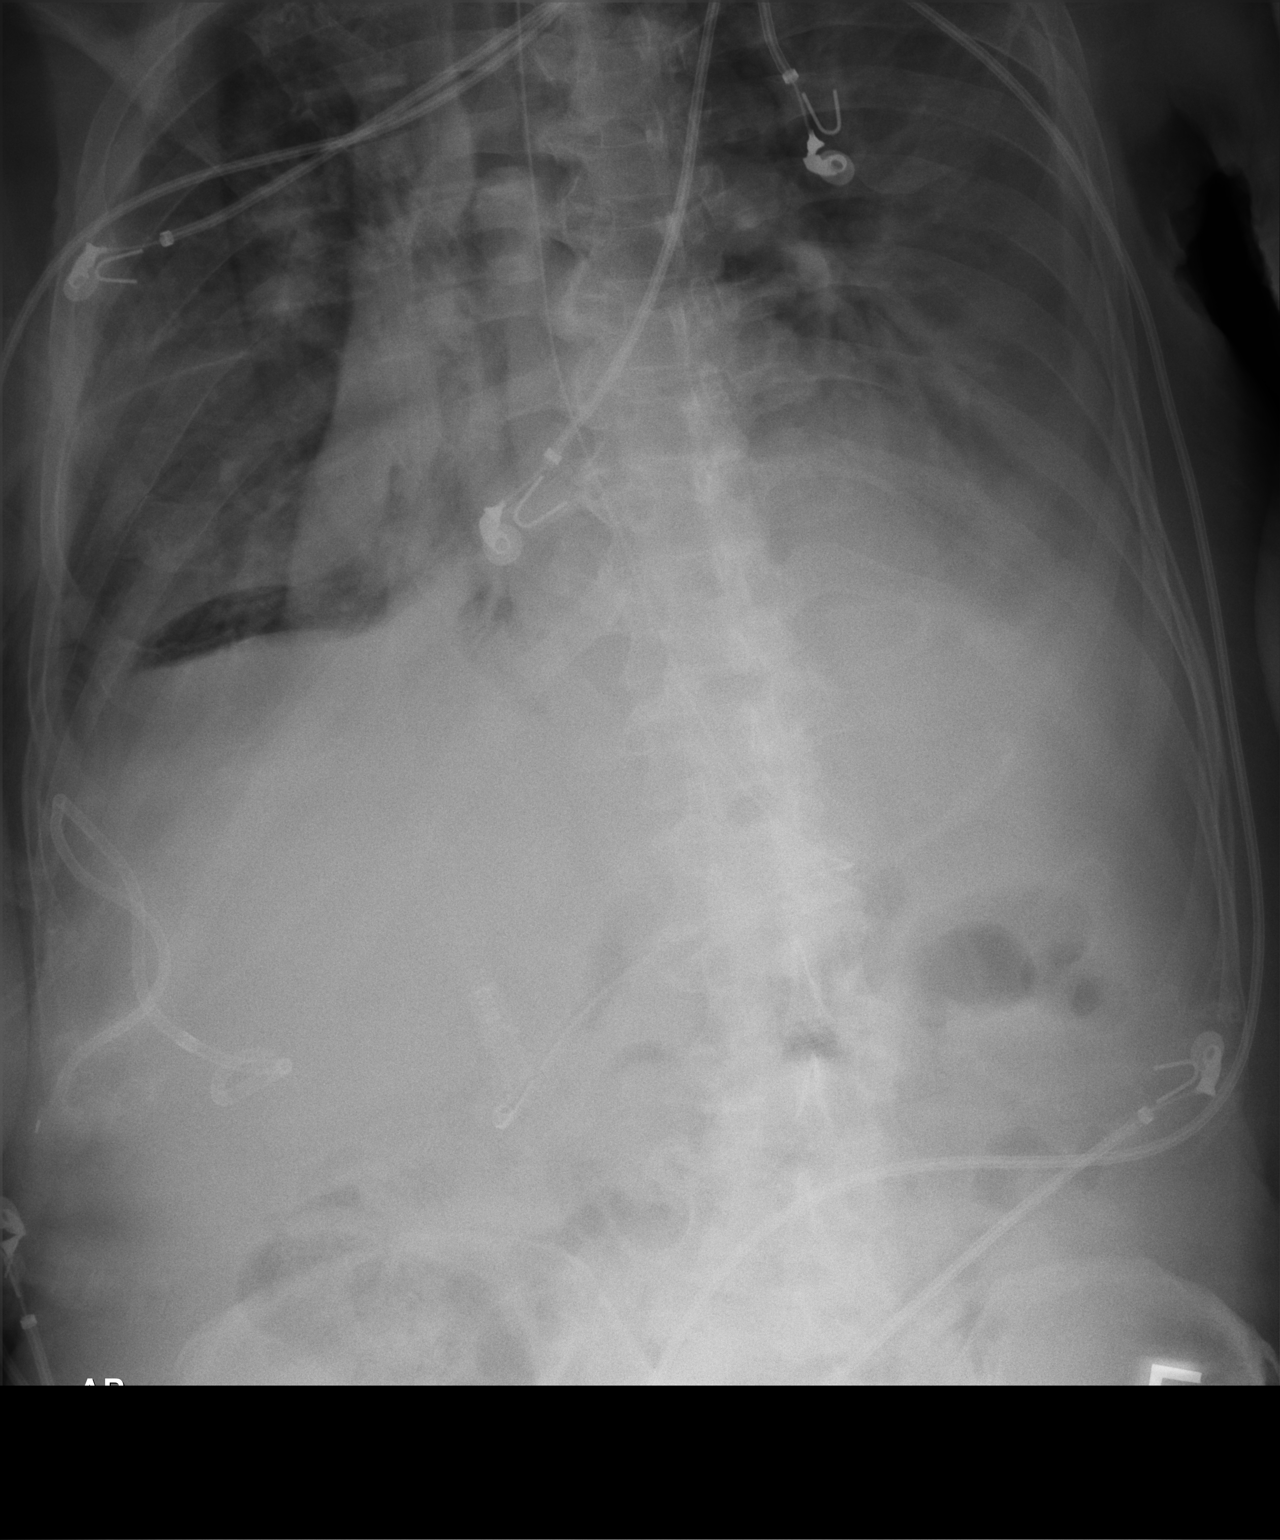

[AP (2 of 2)]
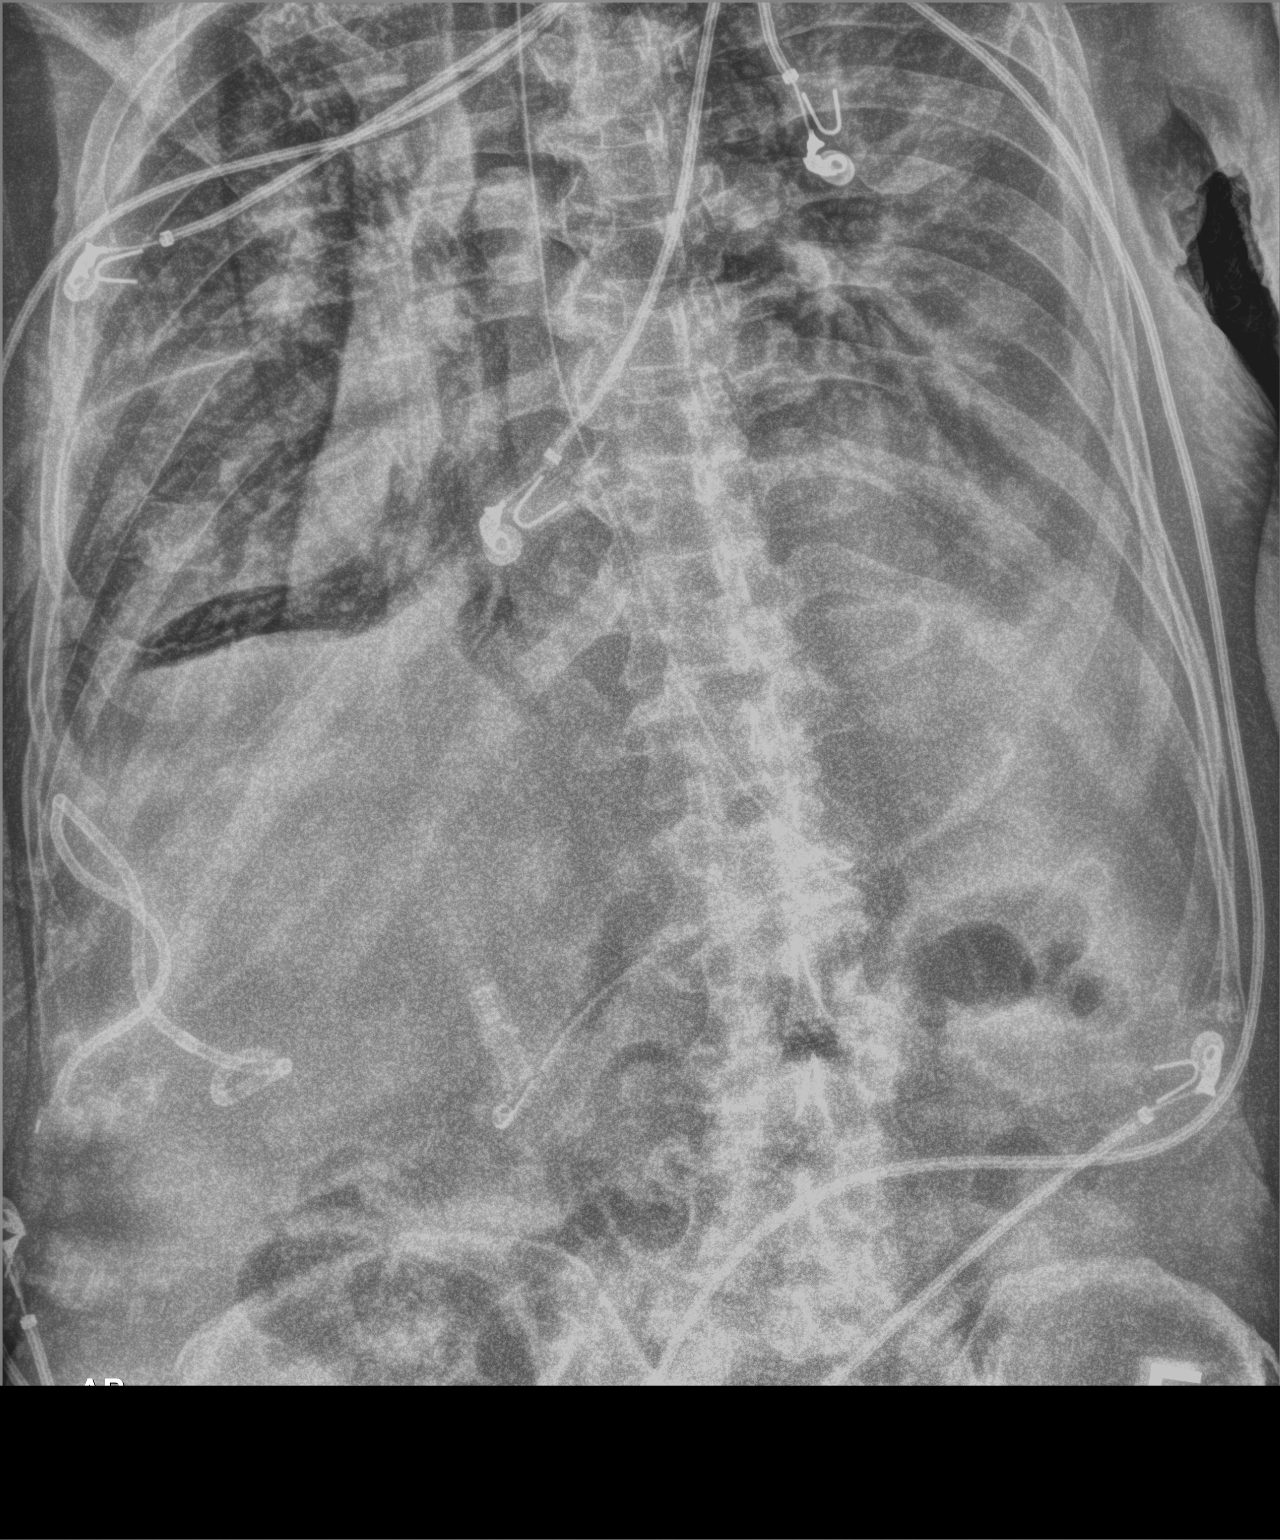

[2 of 2 positions shown; findings below may reference images not displayed]

FINDINGS: The NG tube distal tip is near the antrum. Pleural effusions are
identified. Lucency projected over the low inferior medial right
lower chest is nonspecific and incompletely evaluated on this study.
The cholecystostomy tube is again identified. No other acute
abnormalities.
IMPRESSION: 1.  The NG tube is in good position.

2 the lucency over the medial right chest and right lower chest is
nonspecific. This could be artifactual or represent unusual aerated
lung. A PA and lateral chest x-ray could better evaluate. This does
not have the typical appearance of free air.

## 2017-06-25 IMAGING — CR DG CHEST 1V PORT
1 series · 1 of 1 positions shown · non-contrast
Comparison: 11/28/2016

CLINICAL DATA: Wheezing

EXAM:
PORTABLE CHEST 1 VIEW

[AP]
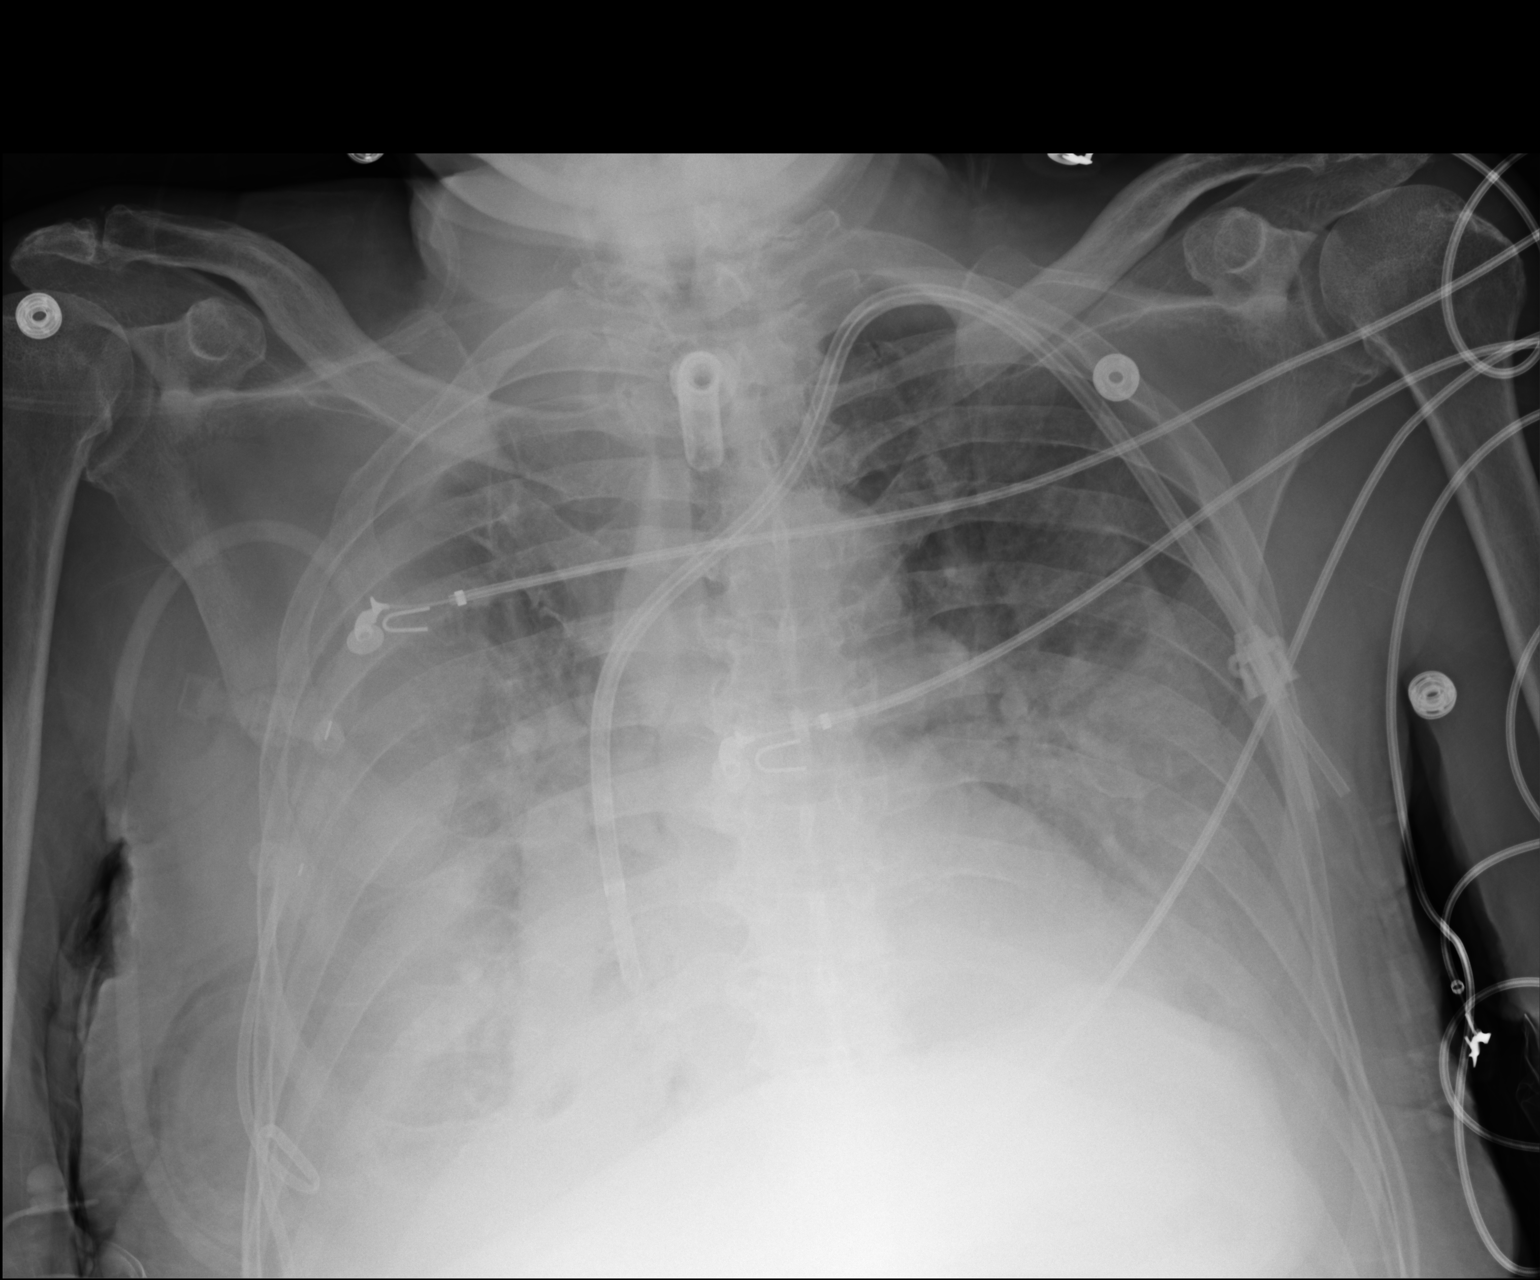

[1 of 1 positions shown; findings below may reference images not displayed]

FINDINGS: Cardiomegaly again noted. There is worsening in aeration with
central vascular congestion and bilateral diffuse airspace disease
right greater than left highly suspicious for pulmonary edema.
Probable small bilateral pleural effusion with bilateral basilar
atelectasis or infiltrate. Stable left dialysis catheter position.
Stable tracheostomy tube position.
IMPRESSION: There is worsening in aeration with central vascular congestion and
bilateral diffuse airspace disease right greater than left highly
suspicious for pulmonary edema. Probable small bilateral pleural
effusion with bilateral basilar atelectasis or infiltrate.

## 2017-07-04 IMAGING — DX DG CHEST 1V PORT
1 series · 1 of 1 positions shown · non-contrast
Comparison: 12/03/2016

CLINICAL DATA: Internal jugular vein thromboembolism

EXAM:
PORTABLE CHEST 1 VIEW

[chest ap]
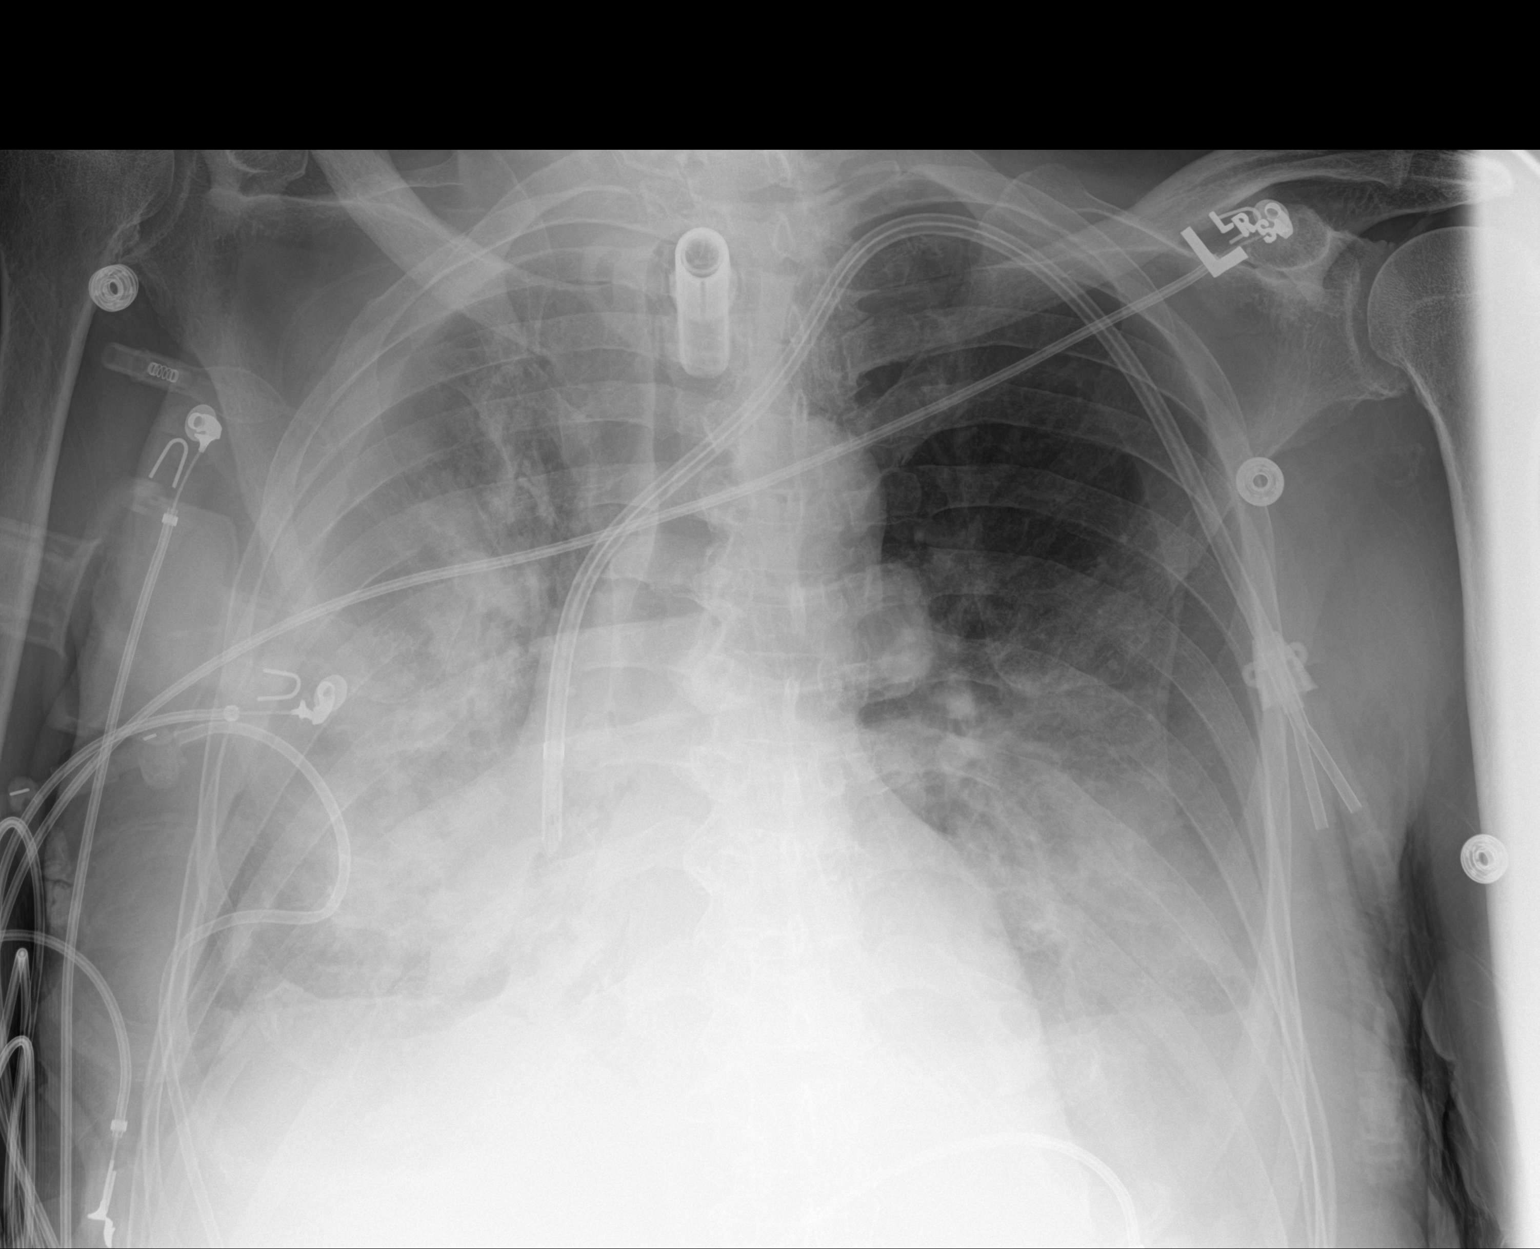

[1 of 1 positions shown; findings below may reference images not displayed]

FINDINGS: Tracheostomy appliance is centered on the airway. Dual-lumen left
jugular central line extends to the cavoatrial junction.

Persistent diffuse airspace opacities bilaterally, right worse than
left. There is some interval improvement with partial clearance on
the left.

Moderate right pleural effusion. This is probably unchanged. No
pneumothorax.
IMPRESSION: 1. Satisfactorily positioned support equipment.
2. Persistent diffuse opacities in both lungs with some interval
clearance of on the left. Persistent right pleural effusion.
